# Patient Record
Sex: Female | Born: 1985 | Race: Black or African American | Hispanic: No | Marital: Single | Smoking: Current every day smoker
Health system: Southern US, Community
[De-identification: ages and names within clinical notes are randomized; demographics above are authoritative.]

## PROBLEM LIST (undated history)

## (undated) ENCOUNTER — Inpatient Hospital Stay: Payer: Self-pay

## (undated) DIAGNOSIS — E049 Nontoxic goiter, unspecified: Secondary | ICD-10-CM

## (undated) DIAGNOSIS — F32A Depression, unspecified: Secondary | ICD-10-CM

## (undated) DIAGNOSIS — D571 Sickle-cell disease without crisis: Secondary | ICD-10-CM

## (undated) DIAGNOSIS — D649 Anemia, unspecified: Secondary | ICD-10-CM

## (undated) DIAGNOSIS — J45909 Unspecified asthma, uncomplicated: Secondary | ICD-10-CM

## (undated) DIAGNOSIS — F329 Major depressive disorder, single episode, unspecified: Secondary | ICD-10-CM

---

## 2014-01-09 ENCOUNTER — Emergency Department: Payer: Self-pay | Admitting: Emergency Medicine

## 2014-01-09 LAB — COMPREHENSIVE METABOLIC PANEL
ALT: 34 U/L
Albumin: 3.4 g/dL (ref 3.4–5.0)
Alkaline Phosphatase: 186 U/L — ABNORMAL HIGH
Anion Gap: 8 (ref 7–16)
BILIRUBIN TOTAL: 0.5 mg/dL (ref 0.2–1.0)
BUN: 13 mg/dL (ref 7–18)
CHLORIDE: 105 mmol/L (ref 98–107)
CREATININE: 0.57 mg/dL — AB (ref 0.60–1.30)
Calcium, Total: 8.9 mg/dL (ref 8.5–10.1)
Co2: 23 mmol/L (ref 21–32)
EGFR (African American): >60
EGFR (Non-African Amer.): >60
Glucose: 74 mg/dL (ref 65–99)
Osmolality: 271 (ref 275–301)
Potassium: 3.7 mmol/L (ref 3.5–5.1)
SGOT(AST): 19 U/L (ref 15–37)
Sodium: 136 mmol/L (ref 136–145)
TOTAL PROTEIN: 8.5 g/dL — AB (ref 6.4–8.2)

## 2014-01-09 LAB — URINALYSIS, COMPLETE
Bacteria: NONE SEEN
Bilirubin,UR: NEGATIVE
Glucose,UR: NEGATIVE mg/dL (ref 0–75)
Nitrite: NEGATIVE
Ph: 5 (ref 4.5–8.0)
Protein: 30
Specific Gravity: 1.03 (ref 1.003–1.030)

## 2014-01-09 LAB — CBC WITH DIFFERENTIAL/PLATELET
BASOS ABS: 0.1 10*3/uL (ref 0.0–0.1)
Basophil %: 0.7 %
EOS ABS: 0.9 10*3/uL — AB (ref 0.0–0.7)
EOS PCT: 7.1 %
HCT: 36.2 % (ref 35.0–47.0)
HGB: 11.6 g/dL — ABNORMAL LOW (ref 12.0–16.0)
LYMPHS ABS: 1.6 10*3/uL (ref 1.0–3.6)
Lymphocyte %: 12.4 %
MCH: 21.4 pg — AB (ref 26.0–34.0)
MCHC: 31.9 g/dL — AB (ref 32.0–36.0)
MCV: 67 fL — ABNORMAL LOW (ref 80–100)
Monocyte #: 1.1 x10 3/mm — ABNORMAL HIGH (ref 0.2–0.9)
Monocyte %: 8.2 %
NEUTROS ABS: 9.5 10*3/uL — AB (ref 1.4–6.5)
Neutrophil %: 71.6 %
PLATELETS: 220 10*3/uL (ref 150–440)
RBC: 5.4 10*6/uL — ABNORMAL HIGH (ref 3.80–5.20)
RDW: 14.9 % — AB (ref 11.5–14.5)
WBC: 13.3 10*3/uL — ABNORMAL HIGH (ref 3.6–11.0)

## 2014-01-09 LAB — RAPID INFLUENZA A&B ANTIGENS

## 2014-01-20 ENCOUNTER — Emergency Department: Payer: Self-pay | Admitting: Emergency Medicine

## 2014-10-14 ENCOUNTER — Other Ambulatory Visit: Payer: Self-pay | Admitting: Physician Assistant

## 2014-10-14 DIAGNOSIS — E010 Iodine-deficiency related diffuse (endemic) goiter: Secondary | ICD-10-CM

## 2014-10-14 DIAGNOSIS — N946 Dysmenorrhea, unspecified: Secondary | ICD-10-CM

## 2014-10-21 ENCOUNTER — Ambulatory Visit
Admission: RE | Admit: 2014-10-21 | Discharge: 2014-10-21 | Disposition: A | Discharge status: Home / Self Care | Payer: Medicaid Other | Source: Ambulatory Visit | Attending: Physician Assistant | Admitting: Physician Assistant

## 2014-10-21 DIAGNOSIS — E010 Iodine-deficiency related diffuse (endemic) goiter: Secondary | ICD-10-CM | POA: Diagnosis not present

## 2014-10-21 DIAGNOSIS — N946 Dysmenorrhea, unspecified: Secondary | ICD-10-CM | POA: Diagnosis not present

## 2014-10-21 DIAGNOSIS — E0590 Thyrotoxicosis, unspecified without thyrotoxic crisis or storm: Secondary | ICD-10-CM | POA: Diagnosis present

## 2015-05-20 ENCOUNTER — Emergency Department
Admission: EM | Admit: 2015-05-20 | Discharge: 2015-05-20 | Disposition: A | Discharge status: Home / Self Care | Payer: Medicaid Other | Attending: Emergency Medicine | Admitting: Emergency Medicine

## 2015-05-20 ENCOUNTER — Encounter: Payer: Self-pay | Admitting: Emergency Medicine

## 2015-05-20 DIAGNOSIS — K0889 Other specified disorders of teeth and supporting structures: Secondary | ICD-10-CM | POA: Diagnosis present

## 2015-05-20 DIAGNOSIS — F17200 Nicotine dependence, unspecified, uncomplicated: Secondary | ICD-10-CM | POA: Diagnosis not present

## 2015-05-20 DIAGNOSIS — K0381 Cracked tooth: Secondary | ICD-10-CM | POA: Diagnosis not present

## 2015-05-20 DIAGNOSIS — K0263 Dental caries on smooth surface penetrating into pulp: Secondary | ICD-10-CM | POA: Insufficient documentation

## 2015-05-20 DIAGNOSIS — K029 Dental caries, unspecified: Secondary | ICD-10-CM

## 2015-05-20 DIAGNOSIS — K032 Erosion of teeth: Secondary | ICD-10-CM | POA: Insufficient documentation

## 2015-05-20 MED ORDER — MAGIC MOUTHWASH W/LIDOCAINE: 5.0000 mL | Freq: Four times a day (QID) | ORAL | Status: DC

## 2015-05-20 NOTE — ED Notes (Signed)
Pt to ed with c/o right sided toothache.

## 2015-05-20 NOTE — ED Provider Notes (Signed)
[facility] Emergency Department Provider Note  ____________________________________________  Time seen: Approximately 2:46 PM  I have reviewed the triage vital signs and the nursing notes.   HISTORY  Chief Complaint Dental Pain    HPI Katherine Hensley is a 30 y.o. female who presents emergency department complaining of right lower dental pain. Patient states that she has been dealing with this for "quite a long time." Patient states that the pain became more severe today. She denies any fevers or chills, difficult breathing or swallowing. Shortness of breath, nausea or vomiting.   History reviewed. No pertinent past medical history.  There are no active problems to display for this patient.   History reviewed. No pertinent past surgical history.  Current Outpatient Rx  Name  Route  Sig  Dispense  Refill  . magic mouthwash w/lidocaine SOLN   Oral   Take 5 mLs by mouth 4 (four) times daily.   240 mL   0     Dispense in a 1/1/1/1 ratio. Use lidocaine, diphen ...     Allergies Review of patient's allergies indicates no known allergies.  History reviewed. No pertinent family history.  Social History Social History  Substance Use Topics  . Smoking status: Current Every Day Smoker  . Smokeless tobacco: None  . Alcohol Use: No     Review of Systems  Constitutional: No fever/chills ENT: No sore throat. Endorses right lower dental pain Cardiovascular: no chest pain. Gastrointestinal:No nausea, no vomiting.   Neurological: Negative for headaches, focal weakness or numbness. 10-point ROS otherwise negative.  ____________________________________________   PHYSICAL EXAM:  VITAL SIGNS: ED Triage Vitals  Enc Vitals Group     BP 05/20/15 1401 131/90 mmHg     Pulse Rate 05/20/15 1401 61     Resp 05/20/15 1401 18     Temp 05/20/15 1401 98 F (36.7 C)     Temp Source 05/20/15 1401 Oral     SpO2 05/20/15 1401 99 %     Weight 05/20/15 1401 160  lb (72.576 kg)     Height 05/20/15 1401  (1.702 m)     Head Cir --      Peak Flow --      Pain Score 05/20/15 1401 8     Pain Loc --      Pain Edu? --      Excl. in GC? --      Constitutional: Alert and oriented. Well appearing and in no acute distress. Eyes: Conjunctivae are normal. PERRL. EOMI. Head: Atraumatic. ENT:      Ears:       Nose: No congestion/rhinnorhea.      Mouth/Throat: Mucous membranes are moist. Poor dentition throughout with multiple caries, erosion, and fractures. Patient is complaining of pain around tooth #32. There is erosion into the pulp in this tooth. No surrounding erythema or edema. No pustular drainage. Neck: No stridor.   Hematological/Lymphatic/Immunilogical: No cervical lymphadenopathy. Cardiovascular: Normal rate, regular rhythm. Normal S1 and S2.  Good peripheral circulation. Respiratory: Normal respiratory effort without tachypnea or retractions. Lungs CTAB. Neurologic:  Normal speech and language. No gross focal neurologic deficits are appreciated.  Skin:  Skin is warm, dry and intact. No rash noted. Psychiatric: Mood and affect are normal. Speech and behavior are normal. Patient exhibits appropriate insight and judgement.   ____________________________________________   LABS (all labs ordered are listed, but only abnormal results are displayed)  Labs Reviewed - No data to display ____________________________________________  EKG   ____________________________________________  RADIOLOGY   No results found.  ____________________________________________    PROCEDURES  Procedure(s) performed:       Medications - No data to display   ____________________________________________   INITIAL IMPRESSION / ASSESSMENT AND PLAN / ED COURSE  Pertinent labs & imaging results that were available during my care of the patient were reviewed by me and considered in my medical decision making (see chart for details).  Patient's  diagnosis is consistent with dental erosion into the pulp. Patient is offered a dental block here in the emergency department and declines at this time. Patient will be sent home with Magic mouthwash for symptom control. There is no signs of infection at this time and no antibiotics will be prescribed at this time. Patient is to follow-up with dentist for further evaluation and treatment of significant poor dentition. Patient is given ED precautions to return to the ED for any worsening or new symptoms.     ____________________________________________  FINAL CLINICAL IMPRESSION(S) / ED DIAGNOSES  Final diagnoses:  Dental erosion extending into pulp  Dental caries      NEW MEDICATIONS STARTED DURING THIS VISIT:  New Prescriptions   MAGIC MOUTHWASH W/LIDOCAINE SOLN    Take 5 mLs by mouth 4 (four) times daily.        Delorise Royals Cuthriell, PA-C 05/20/15 1504  Jene Every, MD 05/20/15 [phone]

## 2015-05-20 NOTE — Discharge Instructions (Signed)
Dental Care and Dentist Visits °Dental care supports good overall health. Regular dental visits can also help you avoid dental pain, bleeding, infection, and other more serious health problems in the future. It is important to keep the mouth healthy because diseases in the teeth, gums, and other oral tissues can spread to other areas of the body. Some problems, such as diabetes, heart disease, and pre-term labor have been associated with poor oral health.  °See your dentist every 6 months. If you experience emergency problems such as a toothache or broken tooth, go to the dentist right away. If you see your dentist regularly, you may catch problems early. It is easier to be treated for problems in the early stages.  °WHAT TO EXPECT AT A DENTIST VISIT  °Your dentist will look for many common oral health problems and recommend proper treatment. At your regular dental visit, you can expect: °· Gentle cleaning of the teeth and gums. This includes scraping and polishing. This helps to remove the sticky substance around the teeth and gums (plaque). Plaque forms in the mouth shortly after eating. Over time, plaque hardens on the teeth as tartar. If tartar is not removed regularly, it can cause problems. Cleaning also helps remove stains. °· Periodic X-rays. These pictures of the teeth and supporting bone will help your dentist assess the health of your teeth. °· Periodic fluoride treatments. Fluoride is a natural mineral shown to help strengthen teeth. Fluoride treatment involves applying a fluoride gel or varnish to the teeth. It is most commonly done in children. °· Examination of the mouth, tongue, jaws, teeth, and gums to look for any oral health problems, such as: °· Cavities (dental caries). This is decay on the tooth caused by plaque, sugar, and acid in the mouth. It is best to catch a cavity when it is small. °· Inflammation of the gums caused by plaque buildup (gingivitis). °· Problems with the mouth or malformed  or misaligned teeth. °· Oral cancer or other diseases of the soft tissues or jaws.  °KEEP YOUR TEETH AND GUMS HEALTHY °For healthy teeth and gums, follow these general guidelines as well as your dentist's specific advice: °· Have your teeth professionally cleaned at the dentist every 6 months. °· Brush twice daily with a fluoride toothpaste. °· Floss your teeth daily.  °· Ask your dentist if you need fluoride supplements, treatments, or fluoride toothpaste. °· Eat a healthy diet. Reduce foods and drinks with added sugar. °· Avoid smoking. °TREATMENT FOR ORAL HEALTH PROBLEMS °If you have oral health problems, treatment varies depending on the conditions present in your teeth and gums. °· Your caregiver will most likely recommend good oral hygiene at each visit. °· For cavities, gingivitis, or other oral health disease, your caregiver will perform a procedure to treat the problem. This is typically done at a separate appointment. Sometimes your caregiver will refer you to another dental specialist for specific tooth problems or for surgery. °SEEK IMMEDIATE DENTAL CARE IF: °· You have pain, bleeding, or soreness in the gum, tooth, jaw, or mouth area. °· A permanent tooth becomes loose or separated from the gum socket. °· You experience a blow or injury to the mouth or jaw area. °  °This information is not intended to replace advice given to you by your health care provider. Make sure you discuss any questions you have with your health care provider. °  °Document Released: 12/12/2010 Document Revised: 06/24/2011 Document Reviewed: 12/12/2010 °Elsevier Interactive Patient Education ©2016 Else[employer] ° °Dental Caries °Dental   caries (also called tooth decay) is the most common oral disease. It can occur at any age but is more common in children and young adults.  °HOW DENTAL CARIES DEVELOPS  °The process of decay begins when bacteria and foods (particularly sugars and starches) combine in your mouth to produce plaque.  Plaque is a substance that sticks to the hard, outer surface of a tooth (enamel). The bacteria in plaque produce acids that attack enamel. These acids may also attack the root surface of a tooth (cementum) if it is exposed. Repeated attacks dissolve these surfaces and create holes in the tooth (cavities). If left untreated, the acids destroy the other layers of the tooth.  °RISK FACTORS °· Frequent sipping of sugary beverages.   °· Frequent snacking on sugary and starchy foods, especially those that easily get stuck in the teeth.   °· Poor oral hygiene.   °· Dry mouth.   °· Substance abuse such as methamphetamine abuse.   °· Broken or poor-fitting dental restorations.   °· Eating disorders.   °· Gastroesophageal reflux disease (GERD).   °· Certain radiation treatments to the head and neck. °SYMPTOMS °In the early stages of dental caries, symptoms are seldom present. Sometimes white, chalky areas may be seen on the enamel or other tooth layers. In later stages, symptoms may include: °· Pits and holes on the enamel. °· Toothache after sweet, hot, or cold foods or drinks are consumed. °· Pain around the tooth. °· Swelling around the tooth. °DIAGNOSIS  °Most of the time, dental caries is detected during a regular dental checkup. A diagnosis is made after a thorough medical and dental history is taken and the surfaces of your teeth are checked for signs of dental caries. Sometimes special instruments, such as lasers, are used to check for dental caries. Dental X-ray exams may be taken so that areas not visible to the eye (such as between the contact areas of the teeth) can be checked for cavities.  °TREATMENT  °If dental caries is in its early stages, it may be reversed with a fluoride treatment or an application of a remineralizing agent at the dental office. Thorough brushing and flossing at home is needed to aid these treatments. If it is in its later stages, treatment depends on the location and extent of tooth  destruction:  °· If a small area of the tooth has been destroyed, the destroyed area will be removed and cavities will be filled with a material such as gold, silver amalgam, or composite resin.   °· If a large area of the tooth has been destroyed, the destroyed area will be removed and a cap (crown) will be fitted over the remaining tooth structure.   °· If the center part of the tooth (pulp) is affected, a procedure called a root canal will be needed before a filling or crown can be placed.   °· If most of the tooth has been destroyed, the tooth may need to be pulled (extracted). °HOME CARE INSTRUCTIONS °You can prevent, stop, or reverse dental caries at home by practicing good oral hygiene. Good oral hygiene includes: °· Thoroughly cleaning your teeth at least twice a day with a toothbrush and dental floss.   °· Using a fluoride toothpaste. A fluoride mouth rinse may also be used if recommended by your dentist or health care provider.   °· Restricting the amount of sugary and starchy foods and sugary liquids you consume.   °· Avoiding frequent snacking on these foods and sipping of these liquids.   °· Keeping regular visits with a dentist for   checkups and cleanings. PREVENTION   Practice good oral hygiene.  Consider a dental sealant. A dental sealant is a coating material that is applied by your dentist to the pits and grooves of teeth. The sealant prevents food from being trapped in them. It may protect the teeth for several years.  Ask about fluoride supplements if you live in a community without fluorinated water or with water that has a low fluoride content. Use fluoride supplements as directed by your dentist or health care provider.  Allow fluoride varnish applications to teeth if directed by your dentist or health care provider.   This information is not intended to replace advice given to you by your health care provider. Make sure you discuss any questions you have with your health care  provider.   Document Released: 12/22/2001 Document Revised: 04/22/2014 Document Reviewed: 04/03/2012 Elsevier Interactive Patient Education 2016 Elsevier Inc.  Dental Pain Dental pain may be caused by many things, including:  Tooth decay (cavities or caries). Cavities expose the nerve of your tooth to air and hot or cold temperatures. This can cause pain or discomfort.  Abscess or infection. A dental abscess is a collection of infected pus from a bacterial infection in the inner part of the tooth (pulp). It usually occurs at the end of the tooth's root.  Injury.  An unknown reason (idiopathic). Your pain may be mild or severe. It may only occur when:  You are chewing.  You are exposed to hot or cold temperature.  You are eating or drinking sugary foods or beverages, such as soda or candy. Your pain may also be constant. HOME CARE INSTRUCTIONS Watch your dental pain for any changes. The following actions may help to lessen any discomfort that you are feeling:  Take medicines only as directed by your dentist.  If you were prescribed an antibiotic medicine, finish all of it even if you start to feel better.  Keep all follow-up visits as directed by your dentist. This is important.  Do not apply heat to the outside of your face.  Rinse your mouth or gargle with salt water if directed by your dentist. This helps with pain and swelling.  You can make salt water by adding  tsp of salt to 1 cup of warm water.  Apply ice to the painful area of your face:  Put ice in a plastic bag.  Place a towel between your skin and the bag.  Leave the ice on for 20 minutes, 2-3 times per day.  Avoid foods or drinks that cause you pain, such as:  Very hot or very cold foods or drinks.  Sweet or sugary foods or drinks. SEEK MEDICAL CARE IF:  Your pain is not controlled with medicines.  Your symptoms are worse.  You have new symptoms. SEEK IMMEDIATE MEDICAL CARE IF:  You are unable  to open your mouth.  You are having trouble breathing or swallowing.  You have a fever.  Your face, neck, or jaw is swollen.   This information is not intended to replace advice given to you by your health care provider. Make sure you discuss any questions you have with your health care provider.   Document Released: 04/01/2005 Document Revised: 08/16/2014 Document Reviewed: 03/28/2014 Elsevier Interactive Patient Education [employer]

## 2015-08-03 ENCOUNTER — Encounter: Payer: Self-pay | Admitting: Emergency Medicine

## 2015-08-03 ENCOUNTER — Emergency Department
Admission: EM | Admit: 2015-08-03 | Discharge: 2015-08-03 | Disposition: A | Discharge status: Home / Self Care | Payer: Medicaid Other | Attending: Emergency Medicine | Admitting: Emergency Medicine

## 2015-08-03 DIAGNOSIS — R109 Unspecified abdominal pain: Secondary | ICD-10-CM | POA: Insufficient documentation

## 2015-08-03 DIAGNOSIS — Z87891 Personal history of nicotine dependence: Secondary | ICD-10-CM | POA: Insufficient documentation

## 2015-08-03 LAB — COMPREHENSIVE METABOLIC PANEL
ALT: 24 U/L (ref 14–54)
ANION GAP: 7 (ref 5–15)
AST: 23 U/L (ref 15–41)
Albumin: 4.5 g/dL (ref 3.5–5.0)
Alkaline Phosphatase: 80 U/L (ref 38–126)
BILIRUBIN TOTAL: 0.4 mg/dL (ref 0.3–1.2)
BUN: 12 mg/dL (ref 6–20)
CO2: 25 mmol/L (ref 22–32)
Calcium: 9.1 mg/dL (ref 8.9–10.3)
Chloride: 105 mmol/L (ref 101–111)
Creatinine, Ser: 0.66 mg/dL (ref 0.44–1.00)
GFR calc Af Amer: >60 mL/min (ref >60)
Glucose, Bld: 97 mg/dL (ref 65–99)
POTASSIUM: 3.9 mmol/L (ref 3.5–5.1)
Sodium: 137 mmol/L (ref 135–145)
TOTAL PROTEIN: 8.3 g/dL — AB (ref 6.5–8.1)

## 2015-08-03 LAB — URINALYSIS COMPLETE WITH MICROSCOPIC (ARMC ONLY)
BACTERIA UA: NONE SEEN
Bilirubin Urine: NEGATIVE
GLUCOSE, UA: NEGATIVE mg/dL
HGB URINE DIPSTICK: NEGATIVE
KETONES UR: NEGATIVE mg/dL
LEUKOCYTES UA: NEGATIVE
Nitrite: NEGATIVE
Protein, ur: NEGATIVE mg/dL
SPECIFIC GRAVITY, URINE: 1.024 (ref 1.005–1.030)
pH: 7 (ref 5.0–8.0)

## 2015-08-03 LAB — CBC
HEMATOCRIT: 34.1 % — AB (ref 35.0–47.0)
HEMOGLOBIN: 11.3 g/dL — AB (ref 12.0–16.0)
MCH: 23 pg — ABNORMAL LOW (ref 26.0–34.0)
MCHC: 33.3 g/dL (ref 32.0–36.0)
MCV: 69.2 fL — ABNORMAL LOW (ref 80.0–100.0)
Platelets: 223 10*3/uL (ref 150–440)
RBC: 4.92 MIL/uL (ref 3.80–5.20)
RDW: 16 % — ABNORMAL HIGH (ref 11.5–14.5)
WBC: 11 10*3/uL (ref 3.6–11.0)

## 2015-08-03 LAB — POCT PREGNANCY, URINE: PREG TEST UR: NEGATIVE

## 2015-08-03 LAB — LIPASE, BLOOD: Lipase: 17 U/L (ref 11–51)

## 2015-08-03 MED ORDER — DICYCLOMINE HCL 10 MG/ML IM SOLN
20.0000 mg | Freq: Once | INTRAMUSCULAR | Status: AC
  Administered 2015-08-03: 20 mg via INTRAMUSCULAR
  Filled 2015-08-03: qty 2

## 2015-08-03 MED ORDER — DICYCLOMINE HCL 20 MG PO TABS: 20.0000 mg | ORAL_TABLET | Freq: Three times a day (TID) | ORAL | Status: DC | PRN

## 2015-08-03 NOTE — Discharge Instructions (Signed)
Please seek medical attention for any high fevers, chest pain, shortness of breath, change in behavior, persistent vomiting, bloody stool or any other new or concerning symptoms. ° ° °Abdominal Pain, Adult °Many things can cause belly (abdominal) pain. Most times, the belly pain is not dangerous. Many cases of belly pain can be watched and treated at home. °HOME CARE  °· Do not take medicines that help you go poop (laxatives) unless told to by your doctor. °· Only take medicine as told by your doctor. °· Eat or drink as told by your doctor. Your doctor will tell you if you should be on a special diet. °GET HELP IF: °· You do not know what is causing your belly pain. °· You have belly pain while you are sick to your stomach (nauseous) or have runny poop (diarrhea). °· You have pain while you pee or poop. °· Your belly pain wakes you up at night. °· You have belly pain that gets worse or better when you eat. °· You have belly pain that gets worse when you eat fatty foods. °· You have a fever. °GET HELP RIGHT AWAY IF:  °· The pain does not go away within 2 hours. °· You keep throwing up (vomiting). °· The pain changes and is only in the right or left part of the belly. °· You have bloody or tarry looking poop. °MAKE SURE YOU:  °· Understand these instructions. °· Will watch your condition. °· Will get help right away if you are not doing well or get worse. °  °This information is not intended to replace advice given to you by your health care provider. Make sure you discuss any questions you have with your health care provider. °  °Document Released: 09/18/2007 Document Revised: 04/22/2014 Document Reviewed: 12/09/2012 °Elsevier Interactive Patient Education ©2016 Elsevier Inc. ° °

## 2015-08-03 NOTE — ED Notes (Signed)
Pt states abd pain that began this am, has had vomiting with it as well, denies diarrhea. States this pain has been off and on for years now and she can't get any answers. Pt tearful in triage. States she has had some rectal bleeding this am as well.

## 2015-08-03 NOTE — ED Provider Notes (Signed)
[facility] Emergency Department Provider Note    ____________________________________________  Time seen: ~1000  I have reviewed the triage vital signs and the nursing notes.   HISTORY  Chief Complaint Abdominal Pain   History limited by: Not Limited   HPI Katherine Hensley is a 30 y.o. female who presents to the emergency department today because of concerns for abdominal pain. Patient states pain is located on the left side. She states that it is been going on for a number of years. She states that woke her up about 2-3 times a month. She has not noticed any pattern to the pain. It is not related to her menstrual cycle. She states that it does not change depending what food she eats. She describes it as the feeling of knots. It will become severe. She does states she has noticed some blood in her diarrhea. She denies any vomiting. Denies any fevers. Denies seen a GI doctor for this although she has seen her primary care. She states she has never been told what it is.   History reviewed. No pertinent past medical history.  There are no active problems to display for this patient.   Past Surgical History  Procedure Laterality Date  . Cesarean section      Current Outpatient Rx  Name  Route  Sig  Dispense  Refill  . magic mouthwash w/lidocaine SOLN   Oral   Take 5 mLs by mouth 4 (four) times daily.   240 mL   0     Dispense in a 1/1/1/1 ratio. Use lidocaine, diphen ...     Allergies Review of patient's allergies indicates no known allergies.  No family history on file.  Social History Social History  Substance Use Topics  . Smoking status: Former Games developermoker  . Smokeless tobacco: None  . Alcohol Use: Yes     Comment: occas    Review of Systems  Constitutional: Negative for fever. Cardiovascular: Negative for chest pain. Respiratory: Negative for shortness of breath. Gastrointestinal:Positive for left-sided abdominal pain Neurological:  Negative for headaches, focal weakness or numbness.  10-point ROS otherwise negative.  ____________________________________________   PHYSICAL EXAM:  VITAL SIGNS: ED Triage Vitals  Enc Vitals Group     BP 08/03/15 0950 116/90 mmHg     Pulse Rate 08/03/15 0950 77     Resp 08/03/15 0950 18     Temp 08/03/15 0950 97.3 F (36.3 C)     Temp Source 08/03/15 0950 Oral     SpO2 08/03/15 0950 99 %     Weight 08/03/15 0950 186 lb (84.369 kg)     Height 08/03/15 0950 5\' 7"  (1.702 m)     Head Cir --      Peak Flow --      Pain Score 08/03/15 0950 10   Constitutional: Alert and oriented. Appears anxious. Eyes: Conjunctivae are normal. PERRL. Normal extraocular movements. ENT   Head: Normocephalic and atraumatic.   Nose: No congestion/rhinnorhea.   Mouth/Throat: Mucous membranes are moist.   Neck: No stridor. Hematological/Lymphatic/Immunilogical: No cervical lymphadenopathy. Cardiovascular: Normal rate, regular rhythm.  No murmurs, rubs, or gallops. Respiratory: Normal respiratory effort without tachypnea nor retractions. Breath sounds are clear and equal bilaterally. No wheezes/rales/rhonchi. Gastrointestinal: Soft and nontender. No distention.  Genitourinary: Deferred Musculoskeletal: Normal range of motion in all extremities. No joint effusions.  No lower extremity tenderness nor edema. Neurologic:  Normal speech and language. No gross focal neurologic deficits are appreciated.  Skin:  Skin is warm,  dry and intact. No rash noted. Psychiatric: Mood and affect are normal. Speech and behavior are normal. Patient exhibits appropriate insight and judgment.  ____________________________________________    LABS (pertinent positives/negatives)  Labs Reviewed  COMPREHENSIVE METABOLIC PANEL - Abnormal; Notable for the following:    Total Protein 8.3 (*)    All other components within normal limits  CBC - Abnormal; Notable for the following:    Hemoglobin 11.3 (*)    HCT  34.1 (*)    MCV 69.2 (*)    MCH 23.0 (*)    RDW 16.0 (*)    All other components within normal limits  URINALYSIS COMPLETEWITH MICROSCOPIC ([facility] ONLY) - Abnormal; Notable for the following:    Color, Urine YELLOW (*)    APPearance HAZY (*)    Squamous Epithelial / LPF 6-30 (*)    All other components within normal limits  LIPASE, BLOOD  POC URINE PREG, ED  POCT PREGNANCY, URINE     ____________________________________________   EKG  None  ____________________________________________    RADIOLOGY  None  ____________________________________________   PROCEDURES  Procedure(s) performed: None  Critical Care performed: No  ____________________________________________   INITIAL IMPRESSION / ASSESSMENT AND PLAN / ED COURSE  Pertinent labs & imaging results that were available during my care of the patient were reviewed by me and considered in my medical decision making (see chart for details).  Patient presented to the emergency department today because of concerns for left-sided abdominal pain. Blood work and urine without any concerning findings. This does seem to be somewhat chronic issue. Patient did appear to feel better after Bentyl. Will discharge home with Bentyl and give GI follow-up.  ____________________________________________   FINAL CLINICAL IMPRESSION(S) / ED DIAGNOSES  Final diagnoses:  Abdominal pain, unspecified abdominal location     Phineas Semen, MD 08/03/15 1309

## 2015-12-09 ENCOUNTER — Emergency Department
Admission: EM | Admit: 2015-12-09 | Discharge: 2015-12-09 | Disposition: A | Discharge status: Home / Self Care | Payer: Medicaid Other | Attending: Emergency Medicine | Admitting: Emergency Medicine

## 2015-12-09 ENCOUNTER — Encounter: Payer: Self-pay | Admitting: Emergency Medicine

## 2015-12-09 DIAGNOSIS — Z87891 Personal history of nicotine dependence: Secondary | ICD-10-CM | POA: Insufficient documentation

## 2015-12-09 DIAGNOSIS — K047 Periapical abscess without sinus: Secondary | ICD-10-CM | POA: Insufficient documentation

## 2015-12-09 MED ORDER — TRAMADOL HCL 50 MG PO TABS: 50.0000 mg | ORAL_TABLET | Freq: Four times a day (QID) | ORAL | 0 refills | Status: DC | PRN

## 2015-12-09 MED ORDER — AMOXICILLIN 500 MG PO CAPS: 500.0000 mg | ORAL_CAPSULE | Freq: Three times a day (TID) | ORAL | 0 refills | Status: DC

## 2015-12-09 MED ORDER — LIDOCAINE VISCOUS 2 % MT SOLN: 5.0000 mL | Freq: Four times a day (QID) | OROMUCOSAL | 0 refills | Status: DC | PRN

## 2015-12-09 MED ORDER — IBUPROFEN 600 MG PO TABS: 600.0000 mg | ORAL_TABLET | Freq: Three times a day (TID) | ORAL | 0 refills | Status: DC | PRN

## 2015-12-09 NOTE — Discharge Instructions (Signed)
Follow-up for list of dental clinics: OPTIONS FOR DENTAL FOLLOW UP CARE  [place] Department of Health and Human Services - Local Safety Net Dental Clinics [email or web address]://[email or web address].gov/dph/oralhealth/services/safetynetclinics.htm   [facility] [phone])  [facility] [phone])  [facility] [phone]  [facility] [phone])  [facility] [phone]) This clinic caters to the indigent population and is on a lottery system. Location: [employer] [address] [address], [address], [place] [facility] Hours: Wednesdays from 6pm - 9pm, patients seen by a lottery system. For dates, call or go to [email or web address] Services: Cleanings, fillings and simple extractions. Payment Options: DENTAL WORK IS FREE OF CHARGE. Bring proof of income or support. Best way to get seen: Arrive at 5:15 pm - this is a lottery, NOT first come/first serve, so arriving earlier will not increase your chances of being seen.     [facility] [phone] Select option 1 for emergencies   Location: [facility], [place], [address], [place] [facility] Hours: No walk-ins accepted - call the day before to schedule an appointment. Check in times are 9:30 am and 1:30 pm. Services: Simple extractions, temporary fillings, pulpectomy/pulp debridement, uncomplicated abscess drainage. Payment Options: PAYMENT IS DUE AT THE TIME OF SERVICE.  Fee is usually $100-200, additional surgical procedures (e.g. abscess drainage) may be extra. Cash, checks, Visa/MasterCard accepted.  Can file Medicaid if patient is covered for dental - patient should call case worker to check. No discount for [facility] patients. Best way to get seen: MUST call the day before and get onto the schedule. Can usually be seen the next 1-2 days. No walk-ins accepted.     [facility] [phone]   Location: [facility], [address], [place] [facility] Hours: M, W, Th, F 8am or 1:30pm, Tues 9a or 1:30 - first come/first served. Services: Simple extractions, temporary fillings, uncomplicated abscess drainage.  You do not need to be an [place] resident. Payment Options: PAYMENT IS DUE AT THE TIME OF SERVICE. Dental insurance, otherwise sliding scale - bring proof of income or support. Depending on income and treatment needed, cost is usually $50-200. Best way to get seen: Arrive early as it is first come/first served.     [facility] [phone]   Location: [address] Clinic Hours: Mon-Thu 8a-5p Services: Most basic dental services including extractions and fillings. Payment Options: PAYMENT IS DUE AT THE TIME OF SERVICE. Sliding scale, up to 50% off - bring proof if income or support. Medicaid with dental option accepted. Best way to get seen: Call to schedule an appointment, can usually be seen within 2 weeks OR they will try to see walk-ins - show up at 8a or 2p (you may have to wait).     [facility] [phone] [place] RESIDENTS ONLY   Location: [facility], [address], [place], [place] [address] Clinic Hours: By appointment only. Monday - Thursday 8am-5pm, Friday 8am-12pm Services: Cleanings, fillings, extractions. Payment Options: PAYMENT IS DUE AT THE TIME OF SERVICE. Cash, Visa or MasterCard. Sliding scale - $30 minimum per service. Best way to get seen: Come in to office, complete packet and make an appointment - need proof of income or support monies for each household member and proof of [facility] residence. Usually takes about a month to get in.     [facility] [phone]  Location: [address], [place] Clinic Hours: Walk-in Urgent Care Dental Services are  offered Monday-Friday mornings only. The numbers of emergencies accepted daily is limited to the number of providers available. Maximum 15 - Mondays, Wednesdays & Thursdays Maximum 10 - Tuesdays & Fridays Services: You do not need to be a [facility] resident to be seen for a dental emergency. Emergencies are defined as pain, swelling, abnormal bleeding, or dental trauma. Walkins will receive x-rays if needed. NOTE: Dental cleaning is not an emergency. Payment Options: PAYMENT IS DUE AT THE TIME OF SERVICE. Minimum co-pay is $40.00 for uninsured patients. Minimum co-pay is $3.00 for Medicaid with dental coverage. Dental Insurance is accepted and must be presented at time of visit. Medicare does not cover dental. Forms of payment: Cash, credit card, checks. Best way to get seen: If not previously registered with the clinic, walk-in dental registration begins at 7:15 am and is on a first come/first serve basis. If previously registered with the clinic, call to make an appointment.     [facility] [place] ONLY   Location: [address], [place], [place] Clinic Hours: Mon-Thu 10a-2p Services: Extractions only! Payment Options: FREE (donations accepted) - bring proof of income or support Best way to get seen: Call and schedule an appointment OR come at 8am on the 1st Monday of every month (except for holidays) when it is first come/first served.     [facility] [phone]   Location: [place], [place] Clinic Hours: Friday mornings Services, Payment Options, Best way to get seen: Call for info

## 2015-12-09 NOTE — ED Provider Notes (Signed)
[facility] Emergency Department Provider Note   ____________________________________________   None    (approximate)  I have reviewed the triage vital signs and the nursing notes.   HISTORY  Chief Complaint Abscess    HPI Katherine Hensley is a 30 y.o. female patient complaining of dental pain is increased in the last 2 days. Patient also has some mild facial swelling to the left low side of her face. Patient aware that she has a D5 [place] [employer] pastime made appointment for a dentist. Patient denies any fever associated with this complaint. Patient rates the pain as a 10 over 10. No positive measures taken today for this complaint.   History reviewed. No pertinent past medical history.  There are no active problems to display for this patient.   Past Surgical History:  Procedure Laterality Date  . CESAREAN SECTION      Prior to Admission medications   Medication Sig Start Date End Date Taking? Authorizing Provider  acetaminophen (TYLENOL) 325 MG tablet Take 650 mg by mouth every 6 (six) hours as needed.    Historical Provider, MD  amoxicillin (AMOXIL) 500 MG capsule Take 1 capsule (500 mg total) by mouth 3 (three) times daily. 12/09/15   Joni Reining, PA-C  dicyclomine (BENTYL) 20 MG tablet Take 1 tablet (20 mg total) by mouth 3 (three) times daily as needed (abdominal pain). 08/03/15   Phineas Semen, MD  ibuprofen (ADVIL,MOTRIN) 600 MG tablet Take 1 tablet (600 mg total) by mouth every 8 (eight) hours as needed. 12/09/15   Joni Reining, PA-C  lidocaine (XYLOCAINE) 2 % solution Use as directed 5 mLs in the mouth or throat every 6 (six) hours as needed for mouth pain. For oral  swish 12/09/15   Joni Reining, PA-C  traMADol (ULTRAM) 50 MG tablet Take 1 tablet (50 mg total) by mouth every 6 (six) hours as needed. 12/09/15 12/08/16  Joni Reining, PA-C    Allergies Review of patient's allergies indicates no known allergies.  History reviewed. No pertinent  family history.  Social History Social History  Substance Use Topics  . Smoking status: Former Games developer  . Smokeless tobacco: Never Used  . Alcohol use Yes     Comment: occas    Review of Systems Constitutional: No fever/chills Eyes: No visual changes. ENT: No sore throat. Dental pain Cardiovascular: Denies chest pain. Respiratory: Denies shortness of breath. Gastrointestinal: No abdominal pain.  No nausea, no vomiting.  No diarrhea.  No constipation. Genitourinary: Negative for dysuria. Musculoskeletal: Negative for back pain. Skin: Negative for rash. Neurological: Negative for headaches, focal weakness or numbness.    ____________________________________________   PHYSICAL EXAM:  VITAL SIGNS: ED Triage Vitals [12/09/15 1457]  Enc Vitals Group     BP 133/85     Pulse Rate 77     Resp 18     Temp 99.6 F (37.6 C)     Temp src      SpO2 99 %     Weight 165 lb (74.8 kg)     Height 5\' 7"  (1.702 m)     Head Circumference      Peak Flow      Pain Score      Pain Loc      Pain Edu?      Excl. in GC?     Constitutional: Alert and oriented. Well appearing and in no acute distress. Eyes: Conjunctivae are normal. PERRL. EOMI. Head: Atraumatic. Nose: No congestion/rhinnorhea. Mouth/Throat:  Mucous membranes are moist. Multiple caries and fractures to the left lower molars. Gingiva is erythematous. Oropharynx non-erythematous. Neck: No stridor.  No cervical spine tenderness to palpation. Hematological/Lymphatic/Immunilogical: No cervical lymphadenopathy. Cardiovascular: Normal rate, regular rhythm. Grossly normal heart sounds.  Good peripheral circulation. Respiratory: Normal respiratory effort.  No retractions. Lungs CTAB. Gastrointestinal: Soft and nontender. No distention. No abdominal bruits. No CVA tenderness. Musculoskeletal: No lower extremity tenderness nor edema.  No joint effusions. Neurologic:  Normal speech and language. No gross focal neurologic deficits are  appreciated. No gait instability. Skin:  Skin is warm, dry and intact. No rash noted. Psychiatric: Mood and affect are normal. Speech and behavior are normal.  ____________________________________________   LABS (all labs ordered are listed, but only abnormal results are displayed)  Labs Reviewed - No data to display ____________________________________________  EKG   ____________________________________________  RADIOLOGY   ____________________________________________   PROCEDURES  Procedure(s) performed: None  Procedures  Critical Care performed: No  ____________________________________________   INITIAL IMPRESSION / ASSESSMENT AND PLAN / ED COURSE  Pertinent labs & imaging results that were available during my care of the patient were reviewed by me and considered in my medical decision making (see chart for details).  Dental pain secondary to multiple caries. Patient given discharge care instructions. Patient given a list of dental clinics for follow-up treatment. Patient given a prescription for tramadol, ibuprofen, Amoxil, and viscous lidocaine.  Clinical Course     ____________________________________________   FINAL CLINICAL IMPRESSION(S) / ED DIAGNOSES  Final diagnoses:  Dental abscess      NEW MEDICATIONS STARTED DURING THIS VISIT:  New Prescriptions   AMOXICILLIN (AMOXIL) 500 MG CAPSULE    Take 1 capsule (500 mg total) by mouth 3 (three) times daily.   IBUPROFEN (ADVIL,MOTRIN) 600 MG TABLET    Take 1 tablet (600 mg total) by mouth every 8 (eight) hours as needed.   LIDOCAINE (XYLOCAINE) 2 % SOLUTION    Use as directed 5 mLs in the mouth or throat every 6 (six) hours as needed for mouth pain. For oral  swish   TRAMADOL (ULTRAM) 50 MG TABLET    Take 1 tablet (50 mg total) by mouth every 6 (six) hours as needed.     Note:  This document was prepared using Dragon voice recognition software and may include unintentional dictation errors.      Joni Reiningonald K Smith, PA-C 12/09/15 1536    Emily FilbertJonathan E Williams, MD 12/10/15 [phone]

## 2015-12-09 NOTE — ED Notes (Signed)
Large area of swelling noted to left jaw area, pt tearful, states tenderness and pain since last night

## 2015-12-09 NOTE — ED Notes (Signed)
Throat is not swollen, area is in the front part of the mouth. Able to swallow with no difficulties

## 2015-12-09 NOTE — ED Triage Notes (Signed)
Abscessed tooth with swelling to left side of face - states has a tooth that needs pulled but hasn't made appt.

## 2015-12-26 ENCOUNTER — Emergency Department
Admission: EM | Admit: 2015-12-26 | Discharge: 2015-12-26 | Disposition: A | Discharge status: Home / Self Care | Payer: Medicaid Other | Attending: Emergency Medicine | Admitting: Emergency Medicine

## 2015-12-26 ENCOUNTER — Encounter: Payer: Self-pay | Admitting: Emergency Medicine

## 2015-12-26 DIAGNOSIS — F17200 Nicotine dependence, unspecified, uncomplicated: Secondary | ICD-10-CM | POA: Insufficient documentation

## 2015-12-26 DIAGNOSIS — Z791 Long term (current) use of non-steroidal anti-inflammatories (NSAID): Secondary | ICD-10-CM | POA: Insufficient documentation

## 2015-12-26 DIAGNOSIS — O99331 Smoking (tobacco) complicating pregnancy, first trimester: Secondary | ICD-10-CM | POA: Insufficient documentation

## 2015-12-26 DIAGNOSIS — Z3A01 Less than 8 weeks gestation of pregnancy: Secondary | ICD-10-CM | POA: Insufficient documentation

## 2015-12-26 DIAGNOSIS — O209 Hemorrhage in early pregnancy, unspecified: Secondary | ICD-10-CM | POA: Insufficient documentation

## 2015-12-26 DIAGNOSIS — N939 Abnormal uterine and vaginal bleeding, unspecified: Secondary | ICD-10-CM

## 2015-12-26 LAB — PREGNANCY, URINE: Preg Test, Ur: NEGATIVE

## 2015-12-26 LAB — HCG, QUANTITATIVE, PREGNANCY: hCG, Beta Chain, Quant, S: <1 m[IU]/mL (ref <5)

## 2015-12-26 NOTE — ED Provider Notes (Signed)
Time Seen: Approximately 1407  I have reviewed the triage notes  Chief Complaint: Vaginal Bleeding   History of Present Illness: Katherine Hensley is a 30 y.o. female who states that she took a pregnancy test yesterday at home and she was positive. She states her last normal menstrual period was August 12. The patient states that this would be her fifth pregnancy she's had 3 live births and one miscarriage. No significant complications with her previous pregnancy. She has no local OB/GYN care. She states that she noticed some vaginal bleeding earlier today and describes it as a spotty vaginal bleeding with some left lower quadrant abdominal cramping. She denies any dysuria, hematuria or urinary frequency. She denies any fever or right side abdominal pain. States pain will occasionally radiate to the back. She did not take anything at home for pain. She denies any fever, nausea, vomiting. She denies any melena or hematochezia. She denies any vaginal discharge   History reviewed. No pertinent past medical history.  There are no active problems to display for this patient.   Past Surgical History:  Procedure Laterality Date  . CESAREAN SECTION      Past Surgical History:  Procedure Laterality Date  . CESAREAN SECTION      Current Outpatient Rx  . Order #: [id number] Class: Historical Med  . Order #: [id number] Class: Print  . Order #: [id number] Class: Print  . Order #: [id number] Class: Print  . Order #: [id number] Class: Print  . Order #: [id number] Class: Print    Allergies:  Review of patient's allergies indicates no known allergies.  Family History: History reviewed. No pertinent family history.  Social History: Social History  Substance Use Topics  . Smoking status: Current Every Day Smoker  . Smokeless tobacco: Never Used  . Alcohol use Yes     Comment: occas     Review of Systems:   10 point review of systems was performed and was otherwise negative:  Constitutional: No  fever Eyes: No visual disturbances ENT: No sore throat, ear pain Cardiac: No chest pain Respiratory: No shortness of breath, wheezing, or stridor Abdomen: Mild left lower quadrant abdominal pain without loose stool or diarrhea Endocrine: No weight loss, No night sweats Extremities: No peripheral edema, cyanosis Skin: No rashes, easy bruising Neurologic: No focal weakness, trouble with speech or swollowing Urologic: No dysuria, Hematuria, or urinary frequency   Physical Exam:  ED Triage Vitals  Enc Vitals Group     BP 12/26/15 1351 136/86     Pulse Rate 12/26/15 1351 81     Resp 12/26/15 1351 18     Temp 12/26/15 1351 98.2 F (36.8 C)     Temp Source 12/26/15 1351 Oral     SpO2 12/26/15 1351 100 %     Weight 12/26/15 1352 165 lb (74.8 kg)     Height 12/26/15 1352 5\' 7"  (1.702 m)     Head Circumference --      Peak Flow --      Pain Score 12/26/15 1352 5     Pain Loc --      Pain Edu? --      Excl. in GC? --     General: Awake , Alert , and Oriented times 3; GCS 15 Head: Normal cephalic , atraumatic Eyes: Pupils equal , round, reactive to light Nose/Throat: No nasal drainage, patent upper airway without erythema or exudate.  Neck: Supple, Full range of motion, No anterior adenopathy or palpable thyroid masses Lungs: Clear to ascultation without wheezes ,  rhonchi, or rales Heart: Regular rate, regular rhythm without murmurs , gallops , or rubs Abdomen: Soft, non tender without rebound, guarding , or rigidity; bowel sounds positive and symmetric in all 4 quadrants. No organomegaly .        Extremities: 2 plus symmetric pulses. No edema, clubbing or cyanosis Neurologic: normal ambulation, Motor symmetric without deficits, sensory intact Skin: warm, dry, no rashes   Labs:   All laboratory work was reviewed including any pertinent negatives or positives listed below:  Labs Reviewed  HCG, QUANTITATIVE, PREGNANCY  CBC WITH DIFFERENTIAL/PLATELET  POC URINE PREG, ED  TYPE  AND SCREEN   Laboratory work was reviewed and showed no clinically significant abnormalities. Pregnancy test was negative by urine and blood work  ED Course:  Patient's stay here was uneventful and her differential dependent on a pregnancy test which seems to be negative at this time and this may just be normal menstrual bleeding. The patient's normally starting her cycle at this particular time. She does not seem to have dysfunctional uterine bleeding. She is otherwise hemodynamically stable and afebrile. Clinical Course     Assessment: Menstrual bleeding Rule out pregnancy      Plan:  Outpatient Patient was advised to return immediately if condition worsens. Patient was advised to follow up with their primary care physician or other specialized physicians involved in their outpatient care. The patient and/or family member/power of attorney had laboratory results reviewed at the bedside. All questions and concerns were addressed and appropriate discharge instructions were distributed by the nursing staff.            Jennye Moccasin, MD 12/26/15 [phone]

## 2015-12-26 NOTE — ED Triage Notes (Signed)
Pt to ed with c/o vaginal bleeding that started today.  Pt reports she took pregnancy test last week and they were positive, reports LMP was 5 weeks ago.  Pt also reprots abd pain and cramping.

## 2015-12-26 NOTE — Discharge Instructions (Signed)
Please return to the emergency department if her bleeding persists beyond your normal menstrual cycle, you develop a fever, increasing abdominal pain, or any other new concerns. Over-the-counter Tylenol or Motrin for pain.  Please return immediately if condition worsens. Please contact her primary physician or the physician you were given for referral. If you have any specialist physicians involved in her treatment and plan please also contact them. Thank you for using [facility] emergency [facility].

## 2016-02-10 ENCOUNTER — Emergency Department
Admission: EM | Admit: 2016-02-10 | Discharge: 2016-02-10 | Disposition: A | Discharge status: Home / Self Care | Payer: Medicaid Other | Attending: Emergency Medicine | Admitting: Emergency Medicine

## 2016-02-10 ENCOUNTER — Encounter: Payer: Self-pay | Admitting: Emergency Medicine

## 2016-02-10 DIAGNOSIS — F17290 Nicotine dependence, other tobacco product, uncomplicated: Secondary | ICD-10-CM | POA: Insufficient documentation

## 2016-02-10 DIAGNOSIS — Z5321 Procedure and treatment not carried out due to patient leaving prior to being seen by health care provider: Secondary | ICD-10-CM | POA: Diagnosis not present

## 2016-02-10 DIAGNOSIS — R1030 Lower abdominal pain, unspecified: Secondary | ICD-10-CM | POA: Insufficient documentation

## 2016-02-10 HISTORY — DX: Sickle-cell disease without crisis: D57.1

## 2016-02-10 LAB — URINALYSIS COMPLETE WITH MICROSCOPIC (ARMC ONLY)
BACTERIA UA: NONE SEEN
BILIRUBIN URINE: NEGATIVE
GLUCOSE, UA: NEGATIVE mg/dL
HGB URINE DIPSTICK: NEGATIVE
Ketones, ur: NEGATIVE mg/dL
LEUKOCYTES UA: NEGATIVE
NITRITE: NEGATIVE
Protein, ur: NEGATIVE mg/dL
SPECIFIC GRAVITY, URINE: 1.021 (ref 1.005–1.030)
pH: 6 (ref 5.0–8.0)

## 2016-02-10 LAB — COMPREHENSIVE METABOLIC PANEL
ALBUMIN: 4.3 g/dL (ref 3.5–5.0)
ALK PHOS: 58 U/L (ref 38–126)
ALT: 16 U/L (ref 14–54)
AST: 18 U/L (ref 15–41)
Anion gap: 8 (ref 5–15)
BILIRUBIN TOTAL: 0.5 mg/dL (ref 0.3–1.2)
BUN: 10 mg/dL (ref 6–20)
CALCIUM: 9 mg/dL (ref 8.9–10.3)
CO2: 24 mmol/L (ref 22–32)
CREATININE: 0.57 mg/dL (ref 0.44–1.00)
Chloride: 104 mmol/L (ref 101–111)
GFR calc Af Amer: >60 mL/min (ref >60)
GLUCOSE: 93 mg/dL (ref 65–99)
Potassium: 3.7 mmol/L (ref 3.5–5.1)
Sodium: 136 mmol/L (ref 135–145)
TOTAL PROTEIN: 8.3 g/dL — AB (ref 6.5–8.1)

## 2016-02-10 LAB — POCT PREGNANCY, URINE: PREG TEST UR: POSITIVE — AB

## 2016-02-10 LAB — HCG, QUANTITATIVE, PREGNANCY: HCG, BETA CHAIN, QUANT, S: 52646 m[IU]/mL — AB (ref <5)

## 2016-02-10 LAB — CBC
HEMATOCRIT: 31 % — AB (ref 35.0–47.0)
Hemoglobin: 10.3 g/dL — ABNORMAL LOW (ref 12.0–16.0)
MCH: 22.3 pg — ABNORMAL LOW (ref 26.0–34.0)
MCHC: 33.1 g/dL (ref 32.0–36.0)
MCV: 67.3 fL — ABNORMAL LOW (ref 80.0–100.0)
PLATELETS: 193 10*3/uL (ref 150–440)
RBC: 4.6 MIL/uL (ref 3.80–5.20)
RDW: 18.9 % — AB (ref 11.5–14.5)
WBC: 8.3 10*3/uL (ref 3.6–11.0)

## 2016-02-10 LAB — LIPASE, BLOOD: LIPASE: 25 U/L (ref 11–51)

## 2016-02-10 MED ORDER — ONDANSETRON 4 MG PO TBDP: 4.0000 mg | ORAL_TABLET | Freq: Once | ORAL | Status: DC | PRN

## 2016-02-10 NOTE — ED Notes (Signed)
Pt denies any nausea at this time. Will hold off on Zofran for now.

## 2016-02-10 NOTE — ED Triage Notes (Signed)
Pt presents to ED with significant other and children with c/o lower abdominal pain that started this morning while she was at work. Pt states she has been having some nausea and bloating. Pt states the pain is intermittent. Denies and pain with urination or foul odor or discharge.

## 2016-02-12 ENCOUNTER — Emergency Department: Payer: Medicaid Other

## 2016-02-12 ENCOUNTER — Encounter: Payer: Self-pay | Admitting: Emergency Medicine

## 2016-02-12 ENCOUNTER — Emergency Department
Admission: EM | Admit: 2016-02-12 | Discharge: 2016-02-13 | Disposition: A | Discharge status: Other Acute Inpatient Hospital | Payer: Medicaid Other | Attending: Emergency Medicine | Admitting: Emergency Medicine

## 2016-02-12 DIAGNOSIS — O99331 Smoking (tobacco) complicating pregnancy, first trimester: Secondary | ICD-10-CM | POA: Diagnosis not present

## 2016-02-12 DIAGNOSIS — S8292XA Unspecified fracture of left lower leg, initial encounter for closed fracture: Secondary | ICD-10-CM | POA: Insufficient documentation

## 2016-02-12 DIAGNOSIS — R102 Pelvic and perineal pain: Secondary | ICD-10-CM | POA: Insufficient documentation

## 2016-02-12 DIAGNOSIS — X810XXA Intentional self-harm by jumping or lying in front of motor vehicle, initial encounter: Secondary | ICD-10-CM | POA: Insufficient documentation

## 2016-02-12 DIAGNOSIS — Y999 Unspecified external cause status: Secondary | ICD-10-CM | POA: Diagnosis not present

## 2016-02-12 DIAGNOSIS — Y92410 Unspecified street and highway as the place of occurrence of the external cause: Secondary | ICD-10-CM | POA: Insufficient documentation

## 2016-02-12 DIAGNOSIS — Z3A01 Less than 8 weeks gestation of pregnancy: Secondary | ICD-10-CM | POA: Diagnosis not present

## 2016-02-12 DIAGNOSIS — F17290 Nicotine dependence, other tobacco product, uncomplicated: Secondary | ICD-10-CM | POA: Diagnosis not present

## 2016-02-12 DIAGNOSIS — Z79899 Other long term (current) drug therapy: Secondary | ICD-10-CM | POA: Insufficient documentation

## 2016-02-12 DIAGNOSIS — O9A211 Injury, poisoning and certain other consequences of external causes complicating pregnancy, first trimester: Secondary | ICD-10-CM | POA: Insufficient documentation

## 2016-02-12 DIAGNOSIS — F1110 Opioid abuse, uncomplicated: Secondary | ICD-10-CM | POA: Diagnosis present

## 2016-02-12 DIAGNOSIS — F17200 Nicotine dependence, unspecified, uncomplicated: Secondary | ICD-10-CM | POA: Diagnosis present

## 2016-02-12 DIAGNOSIS — O26899 Other specified pregnancy related conditions, unspecified trimester: Secondary | ICD-10-CM

## 2016-02-12 DIAGNOSIS — R451 Restlessness and agitation: Secondary | ICD-10-CM

## 2016-02-12 DIAGNOSIS — Y939 Activity, unspecified: Secondary | ICD-10-CM | POA: Diagnosis not present

## 2016-02-12 DIAGNOSIS — O99341 Other mental disorders complicating pregnancy, first trimester: Secondary | ICD-10-CM | POA: Insufficient documentation

## 2016-02-12 DIAGNOSIS — Z3490 Encounter for supervision of normal pregnancy, unspecified, unspecified trimester: Secondary | ICD-10-CM

## 2016-02-12 DIAGNOSIS — F1220 Cannabis dependence, uncomplicated: Secondary | ICD-10-CM | POA: Diagnosis present

## 2016-02-12 DIAGNOSIS — T1491XA Suicide attempt, initial encounter: Secondary | ICD-10-CM | POA: Insufficient documentation

## 2016-02-12 DIAGNOSIS — S82892A Other fracture of left lower leg, initial encounter for closed fracture: Secondary | ICD-10-CM

## 2016-02-12 LAB — URINE DRUG SCREEN, QUALITATIVE (ARMC ONLY)
Amphetamines, Ur Screen: NOT DETECTED
Barbiturates, Ur Screen: NOT DETECTED
Benzodiazepine, Ur Scrn: NOT DETECTED
CANNABINOID 50 NG, UR ~~LOC~~: POSITIVE — AB
COCAINE METABOLITE, UR ~~LOC~~: NOT DETECTED
MDMA (ECSTASY) UR SCREEN: NOT DETECTED
METHADONE SCREEN, URINE: NOT DETECTED
OPIATE, UR SCREEN: POSITIVE — AB
Phencyclidine (PCP) Ur S: NOT DETECTED
TRICYCLIC, UR SCREEN: NOT DETECTED

## 2016-02-12 LAB — URINALYSIS COMPLETE WITH MICROSCOPIC (ARMC ONLY)
BILIRUBIN URINE: NEGATIVE
Bacteria, UA: NONE SEEN
GLUCOSE, UA: NEGATIVE mg/dL
HGB URINE DIPSTICK: NEGATIVE
KETONES UR: NEGATIVE mg/dL
LEUKOCYTES UA: NEGATIVE
Nitrite: NEGATIVE
Protein, ur: NEGATIVE mg/dL
Specific Gravity, Urine: 1.016 (ref 1.005–1.030)
pH: 7 (ref 5.0–8.0)

## 2016-02-12 LAB — CBC WITH DIFFERENTIAL/PLATELET
BASOS ABS: 0 10*3/uL (ref 0–0.1)
BASOS PCT: 0 %
EOS ABS: 0 10*3/uL (ref 0–0.7)
EOS PCT: 0 %
HCT: 30.8 % — ABNORMAL LOW (ref 35.0–47.0)
Hemoglobin: 10 g/dL — ABNORMAL LOW (ref 12.0–16.0)
LYMPHS PCT: 21 %
Lymphs Abs: 2.3 10*3/uL (ref 1.0–3.6)
MCH: 21.8 pg — ABNORMAL LOW (ref 26.0–34.0)
MCHC: 32.4 g/dL (ref 32.0–36.0)
MCV: 67.3 fL — AB (ref 80.0–100.0)
MONO ABS: 0.8 10*3/uL (ref 0.2–0.9)
Monocytes Relative: 7 %
Neutro Abs: 8.1 10*3/uL — ABNORMAL HIGH (ref 1.4–6.5)
Neutrophils Relative %: 72 %
Platelets: 192 10*3/uL (ref 150–440)
RBC: 4.58 MIL/uL (ref 3.80–5.20)
RDW: 18.7 % — AB (ref 11.5–14.5)
WBC: 11.3 10*3/uL — AB (ref 3.6–11.0)

## 2016-02-12 LAB — COMPREHENSIVE METABOLIC PANEL
ALK PHOS: 53 U/L (ref 38–126)
ALT: 14 U/L (ref 14–54)
ANION GAP: 10 (ref 5–15)
AST: 18 U/L (ref 15–41)
Albumin: 4 g/dL (ref 3.5–5.0)
BUN: 9 mg/dL (ref 6–20)
CALCIUM: 9 mg/dL (ref 8.9–10.3)
CO2: 21 mmol/L — AB (ref 22–32)
CREATININE: 0.52 mg/dL (ref 0.44–1.00)
Chloride: 101 mmol/L (ref 101–111)
Glucose, Bld: 99 mg/dL (ref 65–99)
Potassium: 3.2 mmol/L — ABNORMAL LOW (ref 3.5–5.1)
SODIUM: 132 mmol/L — AB (ref 135–145)
TOTAL PROTEIN: 7.6 g/dL (ref 6.5–8.1)
Total Bilirubin: <0.1 mg/dL — ABNORMAL LOW (ref 0.3–1.2)

## 2016-02-12 LAB — PREGNANCY, URINE: Preg Test, Ur: POSITIVE — AB

## 2016-02-12 LAB — HCG, QUANTITATIVE, PREGNANCY: hCG, Beta Chain, Quant, S: 58525 m[IU]/mL — ABNORMAL HIGH (ref <5)

## 2016-02-12 LAB — SALICYLATE LEVEL

## 2016-02-12 LAB — ACETAMINOPHEN LEVEL: Acetaminophen (Tylenol), Serum: <10 ug/mL — ABNORMAL LOW (ref 10–30)

## 2016-02-12 LAB — ETHANOL

## 2016-02-12 MED ORDER — ONDANSETRON HCL 4 MG/2ML IJ SOLN
INTRAMUSCULAR | Status: AC
  Administered 2016-02-12: 4 mg via INTRAVENOUS
  Filled 2016-02-12: qty 2

## 2016-02-12 MED ORDER — ONDANSETRON HCL 4 MG/2ML IJ SOLN
4.0000 mg | Freq: Once | INTRAMUSCULAR | Status: AC
  Administered 2016-02-12: 4 mg via INTRAVENOUS

## 2016-02-12 MED ORDER — HYDROMORPHONE HCL 1 MG/ML IJ SOLN
1.0000 mg | Freq: Once | INTRAMUSCULAR | Status: AC
  Administered 2016-02-12: 1 mg via INTRAVENOUS
  Filled 2016-02-12: qty 1

## 2016-02-12 NOTE — ED Notes (Signed)
Pt. Transported to [place] with officer present.

## 2016-02-12 NOTE — ED Notes (Signed)
Helped pt to bathroom and dressed out pt.

## 2016-02-12 NOTE — ED Notes (Signed)
Pt. Is upset, yelling and cursing, loudly verbalizing her feelings of unjust tx due to "violating her rights" by not allowing her to leave. IVC explained to pt by this RN without pt understanding.
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
## 2016-02-12 NOTE — ED Notes (Signed)
Pt. Returned to tx. room in stable condition with no acute changes since departure from unit for scans.   

## 2016-02-12 NOTE — ED Notes (Signed)
This RN explained that although I cannot do anything to change the status of her tx progression or current IVC status, I'm happy to get her what I am able to, to keep her comfortable during her stay.

## 2016-02-12 NOTE — ED Notes (Signed)
Pt  Moved  To  RM  26   UNDER  IVC  PENDING  CONSULT

## 2016-02-12 NOTE — ED Notes (Signed)
Pt. Provided with sandwich tray and apple juice per pt. Request at MD approval.
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
## 2016-02-13 ENCOUNTER — Inpatient Hospital Stay
Admission: RE | Admit: 2016-02-13 | Discharge: 2016-02-13 | DRG: 781 | Disposition: A | Discharge status: Other Acute Inpatient Hospital | Payer: Medicaid Other | Source: Intra-hospital | Attending: Psychiatry | Admitting: Psychiatry

## 2016-02-13 DIAGNOSIS — O99341 Other mental disorders complicating pregnancy, first trimester: Secondary | ICD-10-CM | POA: Diagnosis present

## 2016-02-13 DIAGNOSIS — O99331 Smoking (tobacco) complicating pregnancy, first trimester: Secondary | ICD-10-CM | POA: Diagnosis present

## 2016-02-13 DIAGNOSIS — O99321 Drug use complicating pregnancy, first trimester: Secondary | ICD-10-CM | POA: Diagnosis present

## 2016-02-13 DIAGNOSIS — F1110 Opioid abuse, uncomplicated: Secondary | ICD-10-CM | POA: Diagnosis present

## 2016-02-13 DIAGNOSIS — R45851 Suicidal ideations: Secondary | ICD-10-CM | POA: Diagnosis present

## 2016-02-13 DIAGNOSIS — F4325 Adjustment disorder with mixed disturbance of emotions and conduct: Secondary | ICD-10-CM | POA: Diagnosis present

## 2016-02-13 DIAGNOSIS — T1491XA Suicide attempt, initial encounter: Secondary | ICD-10-CM | POA: Diagnosis present

## 2016-02-13 DIAGNOSIS — D571 Sickle-cell disease without crisis: Secondary | ICD-10-CM | POA: Diagnosis present

## 2016-02-13 DIAGNOSIS — M79673 Pain in unspecified foot: Secondary | ICD-10-CM | POA: Diagnosis present

## 2016-02-13 DIAGNOSIS — F1220 Cannabis dependence, uncomplicated: Secondary | ICD-10-CM | POA: Diagnosis present

## 2016-02-13 DIAGNOSIS — G4700 Insomnia, unspecified: Secondary | ICD-10-CM | POA: Diagnosis present

## 2016-02-13 DIAGNOSIS — Z3490 Encounter for supervision of normal pregnancy, unspecified, unspecified trimester: Secondary | ICD-10-CM

## 2016-02-13 DIAGNOSIS — F17200 Nicotine dependence, unspecified, uncomplicated: Secondary | ICD-10-CM | POA: Diagnosis present

## 2016-02-13 MED ORDER — ACETAMINOPHEN 325 MG PO TABS
650.0000 mg | ORAL_TABLET | Freq: Four times a day (QID) | ORAL | Status: DC | PRN
  Administered 2016-02-13 (×2): 650 mg via ORAL
  Filled 2016-02-13 (×2): qty 2

## 2016-02-13 MED ORDER — FOLIC ACID 1 MG PO TABS: 2.0000 mg | ORAL_TABLET | Freq: Every day | ORAL | 1 refills | Status: DC

## 2016-02-13 MED ORDER — COMPLETENATE 29-1 MG PO CHEW: 1.0000 | CHEWABLE_TABLET | Freq: Every day | ORAL | Status: DC

## 2016-02-13 MED ORDER — FOLIC ACID 1 MG PO TABS: 2.0000 mg | ORAL_TABLET | Freq: Every day | ORAL | Status: DC

## 2016-02-13 MED ORDER — ZOLPIDEM TARTRATE 5 MG PO TABS
5.0000 mg | ORAL_TABLET | Freq: Every evening | ORAL | Status: DC | PRN
  Administered 2016-02-13: 5 mg via ORAL
  Filled 2016-02-13: qty 1

## 2016-02-13 MED ORDER — NICOTINE 21 MG/24HR TD PT24: 21.0000 mg | MEDICATED_PATCH | Freq: Every day | TRANSDERMAL | Status: DC

## 2016-02-13 MED ORDER — ALUM & MAG HYDROXIDE-SIMETH 200-200-20 MG/5ML PO SUSP: 30.0000 mL | ORAL | Status: DC | PRN

## 2016-02-13 MED ORDER — PRENATAL PLUS 27-1 MG PO TABS
1.0000 | ORAL_TABLET | Freq: Every day | ORAL | Status: DC
  Administered 2016-02-13: 1 via ORAL
  Filled 2016-02-13: qty 1

## 2016-02-13 MED ORDER — MAGNESIUM HYDROXIDE 400 MG/5ML PO SUSP: 30.0000 mL | Freq: Every day | ORAL | Status: DC | PRN

## 2016-02-13 MED ORDER — PRENATAL PLUS 27-1 MG PO TABS: 1.0000 | ORAL_TABLET | Freq: Every day | ORAL | 1 refills | Status: DC

## 2016-02-13 NOTE — Progress Notes (Addendum)
Patient resting in chair in room in a.m. Has been using wheelchair for mobility around the unit. Upset as to why she was on this unit. Is [redacted] weeks pregnant and has splint to left foot from injury prior to unit. Foot has capillary refill less than 3 secs and warm to touch. Did complain of pain and medicated per orders. No SI/HI or AVH. States she sleep fair last night and her appetite has been fair. Goal for today was to discharge, maintain her job, and the needs of her children. Was seen in treatment team and plans to discharge today. Did attend groups today and interacted with peers. Support offered and safety maintained with q15 minute checks. Will continue to monitor.
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
## 2016-02-13 NOTE — [facility] Suicide Risk Assessment (Deleted)
[facility]   Nursing information obtained from:  Patient Demographic factors:  Gay, lesbian, or bisexual orientation Current Mental Status:  NA (Pt denies) Loss Factors:    Historical Factors:    Risk Reduction Factors:     Total Time spent with patient: 1 hour Principal Problem: Adjustment disorder with mixed disturbance of emotions and conduct Diagnosis:   Patient Active Problem List   Diagnosis Date Noted  . Adjustment disorder with mixed disturbance of emotions and conduct [F43.25] 02/13/2016  . Cannabis use disorder, moderate, dependence (HCC) [F12.20] 02/12/2016  . Opioid use disorder, mild, abuse [F11.10] 02/12/2016  . Pregnant and not yet delivered [Z34.90] 02/12/2016  . Suicide attempt [T14.91XA] 02/12/2016  . Tobacco use disorder [F17.200] 02/12/2016   Subjective Data: suicide attempt.  Continued Clinical Symptoms:  Alcohol Use Disorder Identification Test Final Score (AUDIT): 2 The "Alcohol Use Disorders Identification Test", Guidelines for Use in Primary Care, Second Edition.  World Science writerHealth Organization [facility](WHO). Score between 0-7:  no or low risk or alcohol related problems. Score between 8-15:  moderate risk of alcohol related problems. Score between 16-19:  high risk of alcohol related problems. Score 20 or above:  warrants further diagnostic evaluation for alcohol dependence and treatment.   CLINICAL FACTORS:   Depression:   Comorbid alcohol abuse/dependence Impulsivity Alcohol/Substance Abuse/Dependencies   Musculoskeletal: Strength & Muscle Tone: within normal limits Gait & Station: normal Patient leans: N/A  Psychiatric Specialty Exam: Physical Exam  Nursing note and vitals reviewed.   Review of Systems  Psychiatric/Behavioral: Positive for depression, substance abuse and suicidal ideas.  All other systems reviewed and are negative.   Blood pressure 114/62, pulse (!) 55, temperature 97.9 F (36.6 C), temperature source  Oral, resp. rate 18, height 5\' 7"  (1.702 m), weight 72.6 kg (160 lb), last menstrual period 12/15/2015, unknown if currently breastfeeding.Body mass index is 25.06 kg/m.  See H&P                                                  Sleep:  Number of Hours: 3.5      COGNITIVE FEATURES THAT CONTRIBUTE TO RISK:  None    SUICIDE RISK:   Moderate:  Frequent suicidal ideation with limited intensity, and duration, some specificity in terms of plans, no associated intent, good self-control, limited dysphoria/symptomatology, some risk factors present, and identifiable protective factors, including available and accessible social support.   PLAN OF CARE: hospital admission, medication management, substance abuse counseling, discharge planning.   [name] is a89 year old pregnant female admitted after suicide attempt by jumping out of the in the context of severe social stressors.  1. Suicidal ideation. The patient is able to contract for safety in the hospital.  2. Mood. The patient is uncertain if she needs an antidepressant.  3. Pregnancy. We started prenatal vitamin and folic acid supplementation.  4. Smoking. Nicotine patch is available.  5. Insomnia. Ambien is available.  6. Substance abuse. The patient was positive for cannabinoids and opiates. She minimizes her problems and declines treatment.  7. Disposition. She will be discharged to home. She will follow up with [facility].  I certify that inpatient services furnished can reasonably be expected to improve the patient's condition.  Kristine LineaJolanta Pucilowska, MD 02/13/2016, 9:38 AM

## 2016-02-13 NOTE — Plan of Care (Signed)
Problem: Safety: Goal: Ability to remain free from injury will improve Outcome: Progressing Patient has been free from injury on the unit. Does use a wheelchair/walker for mobility due to fracture to left ankle prior to coming to the unit.

## 2016-02-13 NOTE — Progress Notes (Addendum)
  [facility] Discharge Plan :  Will you be returning to the same living situation after discharge:  Yes,  [facility] significant other At discharge, do you have transportation home?: Yes,  pt will be picke up by her significant other Do you have the ability to pay for your medications: Yes,  [facility]  Release of information consent forms completed and in the chart;  Patient's signature needed at discharge.  Patient to Follow up at: Follow-up Information    [facility] .   Why:  Please arrive to the walk-in clinic between the hours of 8am-2:30pm for an assessment for medication management, substance abuse treatment and therapy.  Please call Unk PintoHarvey Bryant at [phone] for questions and assistance. Contact information: [address] [place] [address] [phone]           Next level of care provider [facility] Link:no  Safety Planning and Suicide Prevention discussed: Yes,  completed with pt  Have you used any form of tobacco in the last 30 days? (Cigarettes, Smokeless Tobacco, Cigars, and/or Pipes): Yes  Has patient been referred to the Quitline?: Patient refused referral  Patient has been referred for addiction treatment: Yes  Dorothe PeaJonathan F Morna Flud 02/13/2016, 2:47 PM

## 2016-02-13 NOTE — [facility] Suicide Risk Assessment (Signed)
[facility] INPATIENT:  Family/Significant Other Suicide Prevention Education  Suicide Prevention Education:  Patient Refusal for Family/Significant Other Suicide Prevention Education: The patient Katherine Hensley has refused to provide written consent for family/significant other to be provided Family/Significant Other Suicide Prevention Education during admission and/or prior to discharge.  Physician notified. CSW completed SPE with the pt.    Dorothe PeaJonathan F Vollie Aaron 02/13/2016, 2:33 PM

## 2016-02-13 NOTE — B[facility] Counselor (Signed)
Pt  was admitted and discharged within a 24 hour period and as such, a PSA is not needed.  Dorothe PeaJonathan F. Hollis Tuller, LCSWA, LCAS  02/13/16

## 2016-02-13 NOTE — Tx Team (Addendum)
Interdisciplinary Treatment and Diagnostic Plan Update  02/13/2016 Time of Session: 10:30am Katherine Reevesorma Goslin MRN: [id number]  Principal Diagnosis: Adjustment disorder with mixed disturbance of emotions and conduct  Secondary Diagnoses: Principal Problem:   Adjustment disorder with mixed disturbance of emotions and conduct Active Problems:   Cannabis use disorder, moderate, dependence (HCC)   Opioid use disorder, mild, abuse   Pregnant and not yet delivered   Suicide attempt   Tobacco use disorder   Current Medications:  Current Facility-Administered Medications  Medication Dose Route Frequency Provider Last Rate Last Dose  . acetaminophen (TYLENOL) tablet 650 mg  650 mg Oral Q6H PRN Shari ProwsJolanta B Pucilowska, MD   650 mg at 02/13/16 0936  . alum & mag hydroxide-simeth (MAALOX/MYLANTA) 200-200-20 MG/5ML suspension 30 mL  30 mL Oral Q4H PRN Jolanta B Pucilowska, MD      . folic acid (FOLVITE) tablet 2 mg  2 mg Oral QAC supper Jolanta B Pucilowska, MD      . magnesium hydroxide (MILK OF MAGNESIA) suspension 30 mL  30 mL Oral Daily PRN Jolanta B Pucilowska, MD      . nicotine (NICODERM CQ - dosed in mg/24 hours) patch 21 mg  21 mg Transdermal Q0600 Jolanta B Pucilowska, MD      . prenatal vitamin w/FE, FA (PRENATAL 1 + 1) 27-1 MG tablet 1 tablet  1 tablet Oral Q1200 Jolanta B Pucilowska, MD      . zolpidem (AMBIEN) tablet 5 mg  5 mg Oral QHS PRN Shari ProwsJolanta B Pucilowska, MD   5 mg at 02/13/16 0144   PTA Medications: Prescriptions Prior to Admission  Medication Sig Dispense Refill Last Dose  . acetaminophen (TYLENOL) 325 MG tablet Take 650 mg by mouth every 6 (six) hours as needed.   prn at prn    Patient Stressors: Financial difficulties Marital or family conflict  Patient Strengths: Ability for insight Average or above average intelligence Financial means Supportive family/friends  Treatment Modalities: Medication Management, Group therapy, Case management,  1 to 1 session with clinician,  Psychoeducation, Recreational therapy.   Physician Treatment Plan for Primary Diagnosis: Adjustment disorder with mixed disturbance of emotions and conduct Long Term Goal(s): Improvement in symptoms so as ready for discharge Improvement in symptoms so as ready for discharge   Short Term Goals: Ability to identify changes in lifestyle to reduce recurrence of condition will improve Ability to verbalize feelings will improve Ability to disclose and discuss suicidal ideas Ability to demonstrate self-control will improve Ability to identify and develop effective coping behaviors will improve Ability to maintain clinical measurements within normal limits will improve Compliance with prescribed medications will improve Ability to identify changes in lifestyle to reduce recurrence of condition will improve Ability to demonstrate self-control will improve Ability to identify triggers associated with substance abuse/mental health issues will improve  Medication Management: Evaluate patient's response, side effects, and tolerance of medication regimen.  Therapeutic Interventions: 1 to 1 sessions, Unit Group sessions and Medication administration.  Evaluation of Outcomes: Adequate for discharge  Physician Treatment Plan for Secondary Diagnosis: Principal Problem:   Adjustment disorder with mixed disturbance of emotions and conduct Active Problems:   Cannabis use disorder, moderate, dependence (HCC)   Opioid use disorder, mild, abuse   Pregnant and not yet delivered   Suicide attempt   Tobacco use disorder  Long Term Goal(s): Improvement in symptoms so as ready for discharge Improvement in symptoms so as ready for discharge   Short Term Goals: Ability to identify changes in lifestyle  to reduce recurrence of condition will improve Ability to verbalize feelings will improve Ability to disclose and discuss suicidal ideas Ability to demonstrate self-control will improve Ability to identify and  develop effective coping behaviors will improve Ability to maintain clinical measurements within normal limits will improve Compliance with prescribed medications will improve Ability to identify changes in lifestyle to reduce recurrence of condition will improve Ability to demonstrate self-control will improve Ability to identify triggers associated with substance abuse/mental health issues will improve     Medication Management: Evaluate patient's response, side effects, and tolerance of medication regimen.  Therapeutic Interventions: 1 to 1 sessions, Unit Group sessions and Medication administration.  Evaluation of Outcomes: Adequate for discharge    RN Treatment Plan for Primary Diagnosis: Adjustment disorder with mixed disturbance of emotions and conduct Long Term Goal(s): Knowledge of disease and therapeutic regimen to maintain health will improve  Short Term Goals: Ability to verbalize frustration and anger appropriately will improve and Ability to identify and develop effective coping behaviors will improve  Medication Management: RN will administer medications as ordered by provider, will assess and evaluate patient's response and provide education to patient for prescribed medication. RN will report any adverse and/or side effects to prescribing provider.  Therapeutic Interventions: 1 on 1 counseling sessions, Psychoeducation, Medication administration, Evaluate responses to treatment, Monitor vital signs and CBGs as ordered, Perform/monitor CIWA, COWS, AIMS and Fall Risk screenings as ordered, Perform wound care treatments as ordered.  Evaluation of Outcomes: Adequate for discharge   LCSW Treatment Plan for Primary Diagnosis: Adjustment disorder with mixed disturbance of emotions and conduct Long Term Goal(s): Safe transition to appropriate next level of care at discharge, Engage patient in therapeutic group addressing interpersonal concerns.  Short Term Goals: Engage patient  in aftercare planning with referrals and resources, Increase social support, Increase ability to appropriately verbalize feelings, Increase emotional regulation, Facilitate acceptance of mental health diagnosis and concerns, Facilitate patient progression through stages of change regarding substance use diagnoses and concerns, Identify triggers associated with mental health/substance abuse issues and Increase skills for wellness and recovery  Therapeutic Interventions: Assess for all discharge needs, 1 to 1 time with Social worker, Explore available resources and support systems, Assess for adequacy in community support network, Educate family and significant other(s) on suicide prevention, Complete Psychosocial Assessment, Interpersonal group therapy.  Evaluation of Outcomes: Adequate for discharge   Progress in Treatment: Attending groups: Yes. Participating in groups: Yes. Taking medication as prescribed: Yes. Toleration medication: Yes. Family/Significant other contact made: CSW contacted pt's significant other Chelsea at ph: [phone] Patient understands diagnosis: Yes. Discussing patient identified problems/goals with staff: Yes. Medical problems stabilized or resolved: Yes. Denies suicidal/homicidal ideation: Yes. Issues/concerns per patient self-inventory: No. Other: none reported  New problem(s) identified: No, Describe:  none listed  New Short Term/Long Term Goal(s):  Discharge Plan or Barriers: Marland Kitchen.[facility] [place] [facility] [place] [facility] and will follow up with [facility] medication management, substance abuse treatment and therapy.      Reason for Continuation of Hospitalization: Anxiety Mania Medical Issues Medication stabilization Suicidal ideation Withdrawal symptoms  Estimated Length of Stay: 3-5 days  Attendees: Patient: Katherine Hensley 02/13/2016 11:00 AM  Physician: Dr. Jennet MaduroPucilowska, MD 02/13/2016 11:00 AM  Nursing: Cheri GuppyGigi Maaniatu 02/13/2016 11:00 AM  RN Care Manager:  02/13/2016 11:00 AM  Social Worker: Dorothe PeaJonathan F. Maddyn Lieurance, LCSWA, LCAS 02/13/2016 11:00 AM  Recreational Therapist:  Hershal CoriaBeth Greene, LRT 02/13/2016 11:00 AM  Other:  02/13/2016 11:00 AM  Other:  02/13/2016 11:00 AM  Other: 02/13/2016 11:00 AM    Scribe for Treatment Team: Mercy Riding, LCSWA 02/13/2016

## 2016-02-13 NOTE — Tx Team (Signed)
Initial Treatment Plan 02/13/2016 5:38 AM Katherine Hensley AVW:[id number]    PATIENT STRESSORS: Financial difficulties Marital or family conflict   PATIENT STRENGTHS: Ability for insight Average or above average intelligence Financial means Supportive family/friends   PATIENT IDENTIFIED PROBLEMS: Argument with ex-girlfriend  "I just want to leave."                    DISCHARGE CRITERIA:  Ability to meet basic life and health needs Improved stabilization in mood, thinking, and/or behavior  PRELIMINARY DISCHARGE PLAN: Outpatient therapy  PATIENT/FAMILY INVOLVEMENT: This treatment plan has been presented to and reviewed with the patient, Katherine Hensley.  The patient and family have been given the opportunity to ask questions and make suggestions.  Katherine NeighboursLuke B Jerin Franzel, RN 02/13/2016, 5:38 AM
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
## 2016-02-13 NOTE — [facility] Suicide Risk Assessment (Signed)
[facility]   Principal Problem: Adjustment disorder with mixed disturbance of emotions and conduct Discharge Diagnoses:  Patient Active Problem List   Diagnosis Date Noted  . Adjustment disorder with mixed disturbance of emotions and conduct [F43.25] 02/13/2016  . Cannabis use disorder, moderate, dependence (HCC) [F12.20] 02/12/2016  . Opioid use disorder, mild, abuse [F11.10] 02/12/2016  . Pregnant and not yet delivered [Z34.90] 02/12/2016  . Suicide attempt [T14.91XA] 02/12/2016  . Tobacco use disorder [F17.200] 02/12/2016    Total Time spent with patient: 1 hour  Musculoskeletal: Strength & Muscle Tone: within normal limits Gait & Station: normal Patient leans: N/A  Psychiatric Specialty Exam: Review of Systems  Musculoskeletal:       Foot pain  All other systems reviewed and are negative.   Blood pressure 114/62, pulse (!) 55, temperature 97.9 F (36.6 C), temperature source Oral, resp. rate 18, height 5\' 7"  (1.702 m), weight 72.6 kg (160 lb), last menstrual period 12/15/2015, unknown if currently breastfeeding.Body mass index is 25.06 kg/m.  General Appearance: Casual  Eye Contact::  Good  Speech:  Clear and Coherent409  Volume:  Normal  Mood:  Euthymic  Affect:  Appropriate  Thought Process:  Goal Directed and Descriptions of Associations: Intact  Orientation:  Full (Time, Place, and Person)  Thought Content:  WDL  Suicidal Thoughts:  No  Homicidal Thoughts:  No  Memory:  Immediate;   Fair Recent;   Fair Remote;   Fair  Judgement:  Impaired  Insight:  Fair  Psychomotor Activity:  Normal  Concentration:  Fair  Recall:  [employer]Fair  Fund of Knowledge:Fair  Language: Fair  Akathisia:  No  Handed:  Right  AIMS (if indicated):     Assets:  Communication Skills Desire for Improvement Financial Resources/Insurance Housing Physical Health Resilience Social Support  Sleep:  Number of Hours: 3.5  Cognition: WNL  ADL's:  Intact   Mental  Status Per Nursing Assessment::   On Admission:  NA (Pt denies)  Demographic Factors:  Gay, lesbian, or bisexual orientation  Loss Factors: Loss of significant relationship  Historical Factors: Impulsivity  Risk Reduction Factors:   Responsible for children under 30 years of age, Sense of responsibility to family, Employed, Living with another person, especially a relative and Positive social support  Continued Clinical Symptoms:  Depression:   Comorbid alcohol abuse/dependence Impulsivity  Cognitive Features That Contribute To Risk:  None    Suicide Risk:  Minimal: No identifiable suicidal ideation.  Patients presenting with no risk factors but with morbid ruminations; may be classified as minimal risk based on the severity of the depressive symptoms    Plan Of Care/Follow-up recommendations:  Activity:  As tolerated. Diet:  Regular. Other:  Keep follow-up appointments.  Kristine LineaJolanta Pucilowska, MD 02/13/2016, 2:01 PM
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
## 2016-02-13 NOTE — Progress Notes (Signed)
[facility] Abrasions on both L and R knee. Small abrasion on R pinky toe. Scarred over lacerations on both left and right leg. Patient state it was from years ago d/t climbing fences. No contraband found.

## 2016-02-13 NOTE — Progress Notes (Signed)
Patient discharged with transition record, suicide risk assessment, and discharge summary with instructions and follow-up. Belongings to patient. Discharged via wheelchair to significant other.

## 2016-02-13 NOTE — Plan of Care (Signed)
Problem: Coping: Goal: Ability to demonstrate self-control will improve Outcome: Progressing Pt demonstrated adequate self-control this evening.

## 2016-02-15 ENCOUNTER — Emergency Department
Admission: EM | Admit: 2016-02-15 | Discharge: 2016-02-15 | Disposition: A | Discharge status: Home / Self Care | Payer: Medicaid Other | Attending: Emergency Medicine | Admitting: Emergency Medicine

## 2016-02-15 DIAGNOSIS — F4320 Adjustment disorder, unspecified: Secondary | ICD-10-CM | POA: Insufficient documentation

## 2016-02-15 DIAGNOSIS — S82892S Other fracture of left lower leg, sequela: Secondary | ICD-10-CM | POA: Diagnosis not present

## 2016-02-15 DIAGNOSIS — F1110 Opioid abuse, uncomplicated: Secondary | ICD-10-CM | POA: Diagnosis not present

## 2016-02-15 DIAGNOSIS — O99341 Other mental disorders complicating pregnancy, first trimester: Secondary | ICD-10-CM | POA: Diagnosis not present

## 2016-02-15 DIAGNOSIS — O99331 Smoking (tobacco) complicating pregnancy, first trimester: Secondary | ICD-10-CM | POA: Diagnosis not present

## 2016-02-15 DIAGNOSIS — Z791 Long term (current) use of non-steroidal anti-inflammatories (NSAID): Secondary | ICD-10-CM | POA: Insufficient documentation

## 2016-02-15 DIAGNOSIS — O9A211 Injury, poisoning and certain other consequences of external causes complicating pregnancy, first trimester: Secondary | ICD-10-CM | POA: Insufficient documentation

## 2016-02-15 DIAGNOSIS — F17290 Nicotine dependence, other tobacco product, uncomplicated: Secondary | ICD-10-CM | POA: Insufficient documentation

## 2016-02-15 DIAGNOSIS — O99321 Drug use complicating pregnancy, first trimester: Secondary | ICD-10-CM | POA: Diagnosis not present

## 2016-02-15 DIAGNOSIS — F1220 Cannabis dependence, uncomplicated: Secondary | ICD-10-CM | POA: Diagnosis not present

## 2016-02-15 DIAGNOSIS — Z3A00 Weeks of gestation of pregnancy not specified: Secondary | ICD-10-CM | POA: Insufficient documentation

## 2016-02-15 DIAGNOSIS — X58XXXS Exposure to other specified factors, sequela: Secondary | ICD-10-CM | POA: Diagnosis not present

## 2016-02-15 NOTE — Discharge Instructions (Signed)
Ambulating with support until evaluation by orthopedic clinic

## 2016-02-15 NOTE — ED Triage Notes (Signed)
Pt to ER requesting work note because she is unable to go back to work today due to ankle injury. Pt seen in ER initially for ankle injury. Using walker to ambulate. Has not followed up with ortho.

## 2016-02-15 NOTE — ED Provider Notes (Signed)
[facility] Emergency Department Provider Note   ____________________________________________   First MD Initiated Contact with Patient 02/15/16 1333     (approximate)  I have reviewed the triage vital signs and the nursing notes.   HISTORY  Chief Complaint Letter for School/Work    HPI Katherine Hensley is a 30 y.o. female patient here today for medical clearance score back to work secondary to a fractured ankle. Patient initially was seen in ER and does not have an orthopedic appointment until 02/22/2016. Patient state that the child will accommodate light duty. Patient continue to wear posterior splint applied initial ER visit. Patient ambulates with the use of a walker.Patient rates the pain as a 5/10 patient state increases to 8/10 with prolonged standing. Patient described a pain as "achy". Patient states transient relief with anti-inflammatory medication. Patient requesting additional medications this time. Patient proximal 7-8 weeks' gestation.   Past Medical History:  Diagnosis Date  . Sickle cell anemia [facility](HCC)     Patient Active Problem List   Diagnosis Date Noted  . 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 Cannabis use disorder, moderate, dependence (HCC) 02/12/2016  . Opioid use disorder, mild, abuse 02/12/2016  . Pregnant and not yet delivered 02/12/2016  . Suicide attempt 02/12/2016  . Tobacco use disorder 02/12/2016    Past Surgical History:  Procedure Laterality Date  . CESAREAN SECTION      Prior to Admission medications   Medication Sig Start Date End Date Taking? Authorizing Provider  acetaminophen (TYLENOL) 325 MG tablet Take 650 mg by mouth every 6 (six) hours as needed.    Historical Provider, MD  folic acid (FOLVITE) 1 MG tablet Take 2 tablets (2 mg total) by mouth daily before supper. 02/13/16   Shari ProwsJolanta B Pucilowska, MD  prenatal vitamin w/FE, FA (PRENATAL 1 + 1) 27-1 MG TABS tablet Take  1 tablet by mouth daily at 12 noon. 02/14/16   Shari ProwsJolanta B Pucilowska, MD    Allergies Review of patient's allergies indicates no known allergies.  No family history on file.  Social History Social History  Substance Use Topics  . Smoking status: Current Every Day Smoker    Types: Cigars  . Smokeless tobacco: Never Used  . Alcohol use Yes     Comment: occas    Review of Systems Constitutional: No fever/chills Eyes: No visual changes. ENT: No sore throat. Cardiovascular: Denies chest pain. Respiratory: Denies shortness of breath. Gastrointestinal: No abdominal pain.  No nausea, no vomiting.  No diarrhea.  No constipation. Genitourinary: Negative for dysuria. Musculoskeletal: Left ankle pain Skin: Negative for rash. Neurological: Negative for headaches, focal weakness or numbness. Psychiatric:Adjustment disorder, cannabis dependency and opiate abuse. Suicidal attempt Hematological/Lymphatic:Sickle cell anemia ___________________________________________   PHYSICAL EXAM:  VITAL SIGNS: ED Triage Vitals  Enc Vitals Group     BP 02/15/16 1307 113/64     Pulse Rate 02/15/16 1307 (!) 58     Resp 02/15/16 1307 18     Temp 02/15/16 1307 99 F (37.2 C)     Temp Source 02/15/16 1307 Oral     SpO2 02/15/16 1307 96 %     Weight 02/15/16 1308 160 lb (72.6 kg)     Height 02/15/16 1308 5\' 7"  (1.702 m)     Head Circumference --      Peak Flow --      Pain Score 02/15/16 1308 5     Pain Loc --  Pain Edu? --      Excl. in GC? --     Constitutional: Alert and oriented. Well appearing and in no acute distress. Eyes: Conjunctivae are normal. PERRL. EOMI. Head: Atraumatic. Nose: No congestion/rhinnorhea. Mouth/Throat: Mucous membranes are moist.  Oropharynx non-erythematous. Neck: No stridor. No cervical spine tenderness to palpation. Hematological/Lymphatic/Immunilogical: No cervical lymphadenopathy. Cardiovascular: Normal rate, regular rhythm. Grossly normal heart sounds.   Good peripheral circulation. Respiratory: Normal respiratory effort.  No retractions. Lungs CTAB. Gastrointestinal: Soft and nontender. No distention. No abdominal bruits. No CVA tenderness. Musculoskeletal: Patient is wearing posterior ankle splint.  Neurologic:  Normal speech and language. No gross focal neurologic deficits are appreciated. No gait instability. Skin:  Skin is warm, dry and intact. No rash noted. Psychiatric: Mood and affect are normal. Speech and behavior are normal.  ____________________________________________   LABS (all labs ordered are listed, but only abnormal results are displayed)  Labs Reviewed - No data to display ____________________________________________  EKG   ____________________________________________  RADIOLOGY   ____________________________________________   PROCEDURES  Procedure(s) performed: None  Procedures  Critical Care performed: No  ____________________________________________   INITIAL IMPRESSION / ASSESSMENT AND PLAN / ED COURSE  Pertinent labs & imaging results that were available during my care of the patient were reviewed by me and considered in my medical decision making (see chart for details).  Left ankle fracture. Patient given restrictive work limitation until evaluation by orthopedics. Patient advised to continue ambulating with supportive. No prolonged standing for one week. Continue previous medications.  Clinical Course     ____________________________________________   FINAL CLINICAL IMPRESSION(S) / ED DIAGNOSES  Final diagnoses:  Closed left ankle fracture, sequela      NEW MEDICATIONS STARTED DURING THIS VISIT:  New Prescriptions   No medications on file     Note:  This document was prepared using Dragon voice recognition software and may include unintentional dictation errors.    Joni ReiningRonald K Smith, PA-C 02/15/16 1346    Sharman CheekPhillip Stafford, MD 02/16/16 [phone]

## 2016-05-09 ENCOUNTER — Emergency Department: Payer: Medicaid Other

## 2016-05-09 ENCOUNTER — Encounter: Payer: Self-pay | Admitting: Emergency Medicine

## 2016-05-09 ENCOUNTER — Emergency Department
Admission: EM | Admit: 2016-05-09 | Discharge: 2016-05-09 | Disposition: A | Discharge status: Home / Self Care | Payer: Medicaid Other | Attending: Emergency Medicine | Admitting: Emergency Medicine

## 2016-05-09 DIAGNOSIS — Y999 Unspecified external cause status: Secondary | ICD-10-CM | POA: Diagnosis not present

## 2016-05-09 DIAGNOSIS — O26892 Other specified pregnancy related conditions, second trimester: Secondary | ICD-10-CM

## 2016-05-09 DIAGNOSIS — R109 Unspecified abdominal pain: Secondary | ICD-10-CM | POA: Diagnosis not present

## 2016-05-09 DIAGNOSIS — O9A212 Injury, poisoning and certain other consequences of external causes complicating pregnancy, second trimester: Secondary | ICD-10-CM | POA: Insufficient documentation

## 2016-05-09 DIAGNOSIS — W19XXXA Unspecified fall, initial encounter: Secondary | ICD-10-CM

## 2016-05-09 DIAGNOSIS — Y9302 Activity, running: Secondary | ICD-10-CM | POA: Insufficient documentation

## 2016-05-09 DIAGNOSIS — W01198A Fall on same level from slipping, tripping and stumbling with subsequent striking against other object, initial encounter: Secondary | ICD-10-CM | POA: Insufficient documentation

## 2016-05-09 DIAGNOSIS — Z3A19 19 weeks gestation of pregnancy: Secondary | ICD-10-CM | POA: Insufficient documentation

## 2016-05-09 DIAGNOSIS — O99332 Smoking (tobacco) complicating pregnancy, second trimester: Secondary | ICD-10-CM | POA: Insufficient documentation

## 2016-05-09 DIAGNOSIS — Y92002 Bathroom of unspecified non-institutional (private) residence single-family (private) house as the place of occurrence of the external cause: Secondary | ICD-10-CM | POA: Diagnosis not present

## 2016-05-09 DIAGNOSIS — F17290 Nicotine dependence, other tobacco product, uncomplicated: Secondary | ICD-10-CM | POA: Insufficient documentation

## 2016-05-09 LAB — POCT PREGNANCY, URINE: Preg Test, Ur: POSITIVE — AB

## 2016-05-09 LAB — URINALYSIS, COMPLETE (UACMP) WITH MICROSCOPIC
Bilirubin Urine: NEGATIVE
Glucose, UA: NEGATIVE mg/dL
Hgb urine dipstick: NEGATIVE
KETONES UR: NEGATIVE mg/dL
Leukocytes, UA: NEGATIVE
Nitrite: NEGATIVE
PH: 7 (ref 5.0–8.0)
Protein, ur: NEGATIVE mg/dL
SPECIFIC GRAVITY, URINE: 1.017 (ref 1.005–1.030)

## 2016-05-09 MED ORDER — ACETAMINOPHEN 500 MG PO TABS
1000.0000 mg | ORAL_TABLET | Freq: Once | ORAL | Status: AC
  Administered 2016-05-09: 1000 mg via ORAL
  Filled 2016-05-09: qty 2

## 2016-05-09 NOTE — ED Provider Notes (Signed)
[facility] Emergency Department Provider Note        Time seen: ----------------------------------------- 7:20 AM on 05/09/2016 -----------------------------------------    I have reviewed the triage vital signs and the nursing notes.   HISTORY  Chief Complaint Fall    HPI Katherine Hensley is a 31 y.o. female who presents to the ER after she was walking to the bathroom and she ran to the bathroom door. This caused her to fall and land on her left side. She is complaining of some abdominal pain since the fall. She denies any vaginal bleeding or leakage of fluid or other complaints. Patient states she is [redacted] weeks pregnant.    Past Medical History:  Diagnosis Date  . Sickle cell anemia [facility](HCC)     Patient Active Problem List   Diagnosis Date Noted  . 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 Cannabis use disorder, moderate, dependence (HCC) 02/12/2016  . Opioid use disorder, mild, abuse 02/12/2016  . Pregnant and not yet delivered 02/12/2016  . Suicide attempt 02/12/2016  . Tobacco use disorder 02/12/2016    Past Surgical History:  Procedure Laterality Date  . CESAREAN SECTION      Allergies Patient has no known allergies.  Social History Social History  Substance Use Topics  . Smoking status: Current Every Day Smoker    Types: Cigars  . Smokeless tobacco: Never Used  . Alcohol use Yes     Comment: occas    Review of Systems Constitutional: Negative for fever. Cardiovascular: Negative for chest pain. Respiratory: Negative for shortness of breath. Gastrointestinal: Positive for abdominal pain Genitourinary: Negative for dysuria. Musculoskeletal: Negative for back pain. Skin: Negative for rash. Neurological: Negative for headaches, focal weakness or numbness.  10-point ROS otherwise negative.  ____________________________________________   PHYSICAL EXAM:  VITAL SIGNS: ED Triage Vitals  [05/09/16 0656]  Enc Vitals Group     BP 105/81     Pulse Rate 72     Resp 15     Temp 98.2 F (36.8 C)     Temp Source Oral     SpO2 100 %     Weight 172 lb (78 kg)     Height 5\' 7"  (1.702 m)     Head Circumference      Peak Flow      Pain Score      Pain Loc      Pain Edu?      Excl. in GC?     Constitutional: Alert and oriented. Well appearing and in no distress.Patient tearful Eyes: Conjunctivae are normal. PERRL. Normal extraocular movements. ENT   Head: Normocephalic and atraumatic.   Nose: No congestion/rhinnorhea.   Mouth/Throat: Mucous membranes are moist.   Neck: No stridor. Cardiovascular: Normal rate, regular rhythm. No murmurs, rubs, or gallops. Respiratory: Normal respiratory effort without tachypnea nor retractions. Breath sounds are clear and equal bilaterally. No wheezes/rales/rhonchi. Gastrointestinal: Soft and nontender. Normal bowel sounds, gravid uterus Musculoskeletal: Nontender with normal range of motion in all extremities. No lower extremity tenderness nor edema. Neurologic:  Normal speech and language. No gross focal neurologic deficits are appreciated.  Skin:  Skin is warm, dry and intact. No rash noted. Psychiatric: Depressed mood and affect ____________________________________________  ED COURSE:  Pertinent labs & imaging results that were available during my care of the patient were reviewed by me and considered in my medical decision making (see chart for details). Patient presents to the ER sobbing from a fall from  standing position. We will assess with ultrasound and reevaluate.   Procedures ____________________________________________   LABS (pertinent positives/negatives)  Labs Reviewed  URINALYSIS, COMPLETE (UACMP) WITH MICROSCOPIC - Abnormal; Notable for the following:       Result Value   Color, Urine YELLOW (*)    APPearance CLEAR (*)    Bacteria, UA RARE (*)    Squamous Epithelial / LPF 0-5 (*)    All other  components within normal limits  POCT PREGNANCY, URINE - Abnormal; Notable for the following:    Preg Test, Ur POSITIVE (*)    All other components within normal limits    RADIOLOGY Images were viewed by me  Ultrasound OB limited IMPRESSION: Single viable intrauterine pregnancy at 19 weeks 5 days. Amniotic fluid volume appears subjectively low. AFI 10.9 cm.  This exam is performed on an emergent basis and does not comprehensively evaluate fetal size, dating, or anatomy; follow-up complete OB US should be considered if further fetal assessment is warranted. ____________________________________________  FINAL ASSESSMENT AND PLAN  Fall, abdominal pain, second trimester pregnancy  Plan: Patient with labs and imaging as dictated above. Patient presents to ER with abdominal pain after a fall from standing position. Ultrasound reveals slightly low amniotic fluid volume but otherwise normal appearing ultrasound. She will need close outpatient follow-up with OB/GYN.   Emily Filbert, MD   Note: This note was generated in part or whole with voice recognition software. Voice recognition is usually quite accurate but there are transcription errors that can and very often do occur. I apologize for any typographical errors that were not detected and corrected.     Emily Filbert, MD 05/09/16 [phone]

## 2016-05-09 NOTE — ED Triage Notes (Signed)
Pt ambulatory to triage with steady slow gait. Pt reports she ran into her bathroom door this AM, causing her to trip and fall onto her side. Pt is c/o of abdominal pain since fall. Pt is [redacted] weeks pregnant.

## 2016-05-09 NOTE — ED Notes (Signed)
AAOx3.  Skin warm and dry.  Ambulates with easy and steady gait. NAD 

## 2016-05-26 ENCOUNTER — Encounter: Payer: Self-pay | Admitting: Emergency Medicine

## 2016-05-26 ENCOUNTER — Emergency Department
Admission: EM | Admit: 2016-05-26 | Discharge: 2016-05-26 | Disposition: A | Discharge status: Home / Self Care | Payer: Medicaid Other | Attending: Emergency Medicine | Admitting: Emergency Medicine

## 2016-05-26 DIAGNOSIS — Z79899 Other long term (current) drug therapy: Secondary | ICD-10-CM | POA: Diagnosis not present

## 2016-05-26 DIAGNOSIS — O99332 Smoking (tobacco) complicating pregnancy, second trimester: Secondary | ICD-10-CM | POA: Diagnosis not present

## 2016-05-26 DIAGNOSIS — J111 Influenza due to unidentified influenza virus with other respiratory manifestations: Secondary | ICD-10-CM

## 2016-05-26 DIAGNOSIS — F17290 Nicotine dependence, other tobacco product, uncomplicated: Secondary | ICD-10-CM | POA: Insufficient documentation

## 2016-05-26 DIAGNOSIS — Z3A21 21 weeks gestation of pregnancy: Secondary | ICD-10-CM | POA: Diagnosis not present

## 2016-05-26 DIAGNOSIS — O99512 Diseases of the respiratory system complicating pregnancy, second trimester: Secondary | ICD-10-CM | POA: Insufficient documentation

## 2016-05-26 LAB — URINALYSIS, COMPLETE (UACMP) WITH MICROSCOPIC
Bacteria, UA: NONE SEEN
Bilirubin Urine: NEGATIVE
GLUCOSE, UA: NEGATIVE mg/dL
Hgb urine dipstick: NEGATIVE
Ketones, ur: 80 mg/dL — AB
LEUKOCYTES UA: NEGATIVE
Nitrite: NEGATIVE
PH: 6 (ref 5.0–8.0)
Protein, ur: 30 mg/dL — AB
SPECIFIC GRAVITY, URINE: 1.026 (ref 1.005–1.030)

## 2016-05-26 MED ORDER — OSELTAMIVIR PHOSPHATE 75 MG PO CAPS: 75.0000 mg | ORAL_CAPSULE | Freq: Two times a day (BID) | ORAL | 0 refills | Status: AC

## 2016-05-26 MED ORDER — ONDANSETRON 4 MG PO TBDP: 4.0000 mg | ORAL_TABLET | Freq: Four times a day (QID) | ORAL | 0 refills | Status: DC | PRN

## 2016-05-26 MED ORDER — ONDANSETRON 4 MG PO TBDP
4.0000 mg | ORAL_TABLET | Freq: Once | ORAL | Status: AC
  Administered 2016-05-26: 4 mg via ORAL
  Filled 2016-05-26: qty 1

## 2016-05-26 MED ORDER — FLUTICASONE PROPIONATE 50 MCG/ACT NA SUSP: 2.0000 | Freq: Every day | NASAL | 0 refills | Status: DC

## 2016-05-26 MED ORDER — ACETAMINOPHEN 500 MG PO TABS
1000.0000 mg | ORAL_TABLET | Freq: Once | ORAL | Status: AC
  Administered 2016-05-26: 1000 mg via ORAL
  Filled 2016-05-26: qty 2

## 2016-05-26 NOTE — ED Provider Notes (Signed)
[facility] Emergency Department Provider Note ____________________________________________  Time seen: 1012  I have reviewed the triage vital signs and the nursing notes.  HISTORY  Chief Complaint  Fever; Generalized Body Aches; and Cough  HPI Katherine Hensley is a 31 y.o. female [redacted] weeks gestation, presents to the ED for evaluation of flulike symptoms. Patient describes onset of symptoms 2 days prior. Since that time she's had cough, body aches, fever, and nausea.He has only dosed Tylenol for her symptoms, she is unaware of what other medications are safe for symptoms to her pregnancy. She denies any abnormal vaginal bleeding. She notes some pain to the bilateral lower ribs, fatigue, and inability to keep down fluids. She does report normal urinary output. She did not receive the seasonal flu vaccine.  Past Medical History:  Diagnosis Date  . Sickle cell anemia [facility])     Patient Active Problem List   Diagnosis Date Noted  . 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 Cannabis use disorder, moderate, dependence (HCC) 02/12/2016  . Opioid use disorder, mild, abuse 02/12/2016  . Pregnant and not yet delivered 02/12/2016  . Suicide attempt 02/12/2016  . Tobacco use disorder 02/12/2016    Past Surgical History:  Procedure Laterality Date  . CESAREAN SECTION      Prior to Admission medications   Medication Sig Start Date End Date Taking? Authorizing Provider  acetaminophen (TYLENOL) 325 MG tablet Take 650 mg by mouth every 6 (six) hours as needed.    Historical Provider, MD  ferrous sulfate (FERROUSUL) 325 (65 FE) MG tablet Take 325 mg by mouth daily.    Historical Provider, MD  fluticasone (FLONASE) 50 MCG/ACT nasal spray Place 2 sprays into both nostrils daily. 05/26/16   Danylle Ouk V Bacon Carlyne Keehan, PA-C  folic acid (FOLVITE) 1 MG tablet Take 2 tablets (2 mg total) by mouth daily before supper. 02/13/16   Shari Prows, MD   ondansetron (ZOFRAN ODT) 4 MG disintegrating tablet Take 1 tablet (4 mg total) by mouth every 6 (six) hours as needed for nausea or vomiting. 05/26/16   Charlesetta Ivory Karstyn Birkey, PA-C  oseltamivir (TAMIFLU) 75 MG capsule Take 1 capsule (75 mg total) by mouth 2 (two) times daily. 05/26/16 05/31/16  Charlesetta Ivory Torben Soloway, PA-C  prenatal vitamin w/FE, FA (PRENATAL 1 + 1) 27-1 MG TABS tablet Take 1 tablet by mouth daily at 12 noon. 02/14/16   Shari Prows, MD  sertraline (ZOLOFT) 50 MG tablet Take 50 mg by mouth daily. 04/19/16   Historical Provider, MD    Allergies Patient has no known allergies.  No family history on file.  Social History Social History  Substance Use Topics  . Smoking status: Current Every Day Smoker    Types: Cigars  . Smokeless tobacco: Never Used  . Alcohol use Yes     Comment: occas    Review of Systems  Constitutional: Positive for fever. Eyes: Negative for visual changes. ENT: Negative for sore throat. Cardiovascular: Negative for chest pain. Respiratory: Negative for shortness of breath. Reports nonproductive cough. Gastrointestinal: Negative for abdominal pain, vomiting and diarrhea. Reports nausea. Genitourinary: Negative for dysuria. Musculoskeletal: Negative for back pain. Reports generalized bodyaches. Skin: Negative for rash. Neurological: Negative for headaches, focal weakness or numbness. ____________________________________________  PHYSICAL EXAM:  VITAL SIGNS: ED Triage Vitals  Enc Vitals Group     BP 05/26/16 0849 (!) 115/55     Pulse Rate 05/26/16 0849 95  Resp 05/26/16 0849 18     Temp 05/26/16 0849 100.1 F (37.8 C)     Temp Source 05/26/16 0849 Oral     SpO2 05/26/16 0849 98 %     Weight --      Height --      Head Circumference --      Peak Flow --      Pain Score 05/26/16 0845 8     Pain Loc --      Pain Edu? --      Excl. in GC? --     Constitutional: Alert and oriented. Well appearing and in no distress. Head:  Normocephalic and atraumatic. Eyes: Conjunctivae are normal. PERRL. Normal extraocular movements Ears: Canals clear. TMs intact bilaterally. Nose: No congestion/rhinorrhea/epistaxis. Mouth/Throat: Mucous membranes are moist. Uvula is midline and tonsils are flat. Tonsils without erythema, edema, or exudate. Neck: Supple. No thyromegaly. Hematological/Lymphatic/Immunological: No cervical lymphadenopathy. Cardiovascular: Normal rate, regular rhythm. Normal distal pulses. Respiratory: Normal respiratory effort. No wheezes/rales/rhonchi. Gastrointestinal: Rapid, soft and nontender. No distention, rebound, or guarding. Normoactive bowel sounds. No CVA tenderness noted. Musculoskeletal: Nontender with normal range of motion in all extremities.  Neurologic:  Normal gait without ataxia. Normal speech and language. No gross focal neurologic deficits are appreciated. Skin:  Skin is warm, dry and intact. No rash noted. ____________________________________________   LABS (pertinent positives/negatives)  Labs Reviewed  URINALYSIS, COMPLETE (UACMP) WITH MICROSCOPIC - Abnormal; Notable for the following:       Result Value   Color, Urine YELLOW (*)    APPearance CLEAR (*)    Ketones, ur 80 (*)    Protein, ur 30 (*)    Squamous Epithelial / LPF 0-5 (*)    All other components within normal limits  ____________________________________________  PROCEDURES  Zofran 4 mg ODT Tylenol 1000 mg PO ____________________________________________  INITIAL IMPRESSION / ASSESSMENT AND PLAN / ED COURSE  Pregnant patient [redacted] weeks gestation, with presentation of flulike symptoms. She is discharged at this time after benign exam, with instructions on management of her symptoms. She is advised of several medications that are safe to dose during pregnancy, including, Benadryl, Claritin, Flonase, Delsym, and Tylenol. She is provided with the patient for Flonase, Zofran, and Tamiflu. She is encouraged to maintain  continued to push fluids to be due to stress and dehydration. She is also advised to avoid anti-inflammatories and decongestants. She will follow with her primary care provider for ongoing symptom management. Return to the ED for acutely worsening symptoms as discussed. ____________________________________________  FINAL CLINICAL IMPRESSION(S) / ED DIAGNOSES  Final diagnoses:  Influenza      Lissa HoardJenise V Bacon Daliya Parchment, PA-C 05/26/16 1834    Governor Rooksebecca Lord, MD 05/28/16 2250

## 2016-05-26 NOTE — ED Notes (Signed)
NAD noted at time of D/C. Pt denies questions or concerns. Pt ambulatory to the lobby at this time.  

## 2016-05-26 NOTE — ED Triage Notes (Signed)
C/O cough, body aches, fever, and nausea.  Onset of symptoms Friday.  Last medicated for fever yesterday evening. Patient is [redacted] weeks pregnant.  EDC: 09/30/16.  P4 G3

## 2016-05-26 NOTE — Discharge Instructions (Signed)
Your symptoms are consistent with influenza (flu). You should continue to rest and hydrate. Take the anti-nausea medicine to allow you to eat and drink to avoid dehydration. Take the Tamiflu as directed for shortening the duration of your flu. You may take OTC Delsym, Benadryl/Claritin, Allegra/Zyrtec, Tylenol, or nasal saline; safely during pregnancy. Avoid decongestants (Sudafed, Sudafed PE, phenylephrine) and anti-inflammatories (Ibuprofen, naproxen, Motrin, Advil, Aleve). See your OB provider as needed.

## 2016-06-15 ENCOUNTER — Encounter: Payer: Self-pay | Admitting: *Deleted

## 2016-06-15 ENCOUNTER — Observation Stay
Admission: EM | Admit: 2016-06-15 | Discharge: 2016-06-15 | Disposition: A | Discharge status: Home / Self Care | Payer: Medicaid Other | Attending: Obstetrics and Gynecology | Admitting: Obstetrics and Gynecology

## 2016-06-15 DIAGNOSIS — O26852 Spotting complicating pregnancy, second trimester: Secondary | ICD-10-CM | POA: Diagnosis present

## 2016-06-15 DIAGNOSIS — Z3A24 24 weeks gestation of pregnancy: Secondary | ICD-10-CM | POA: Diagnosis not present

## 2016-06-15 DIAGNOSIS — D573 Sickle-cell trait: Secondary | ICD-10-CM | POA: Diagnosis not present

## 2016-06-15 DIAGNOSIS — N939 Abnormal uterine and vaginal bleeding, unspecified: Secondary | ICD-10-CM | POA: Diagnosis present

## 2016-06-15 DIAGNOSIS — O99012 Anemia complicating pregnancy, second trimester: Secondary | ICD-10-CM | POA: Diagnosis not present

## 2016-06-15 HISTORY — DX: Anemia, unspecified: D64.9

## 2016-06-15 HISTORY — DX: Nontoxic goiter, unspecified: E04.9

## 2016-06-15 HISTORY — DX: Unspecified asthma, uncomplicated: J45.909

## 2016-06-15 LAB — URINALYSIS, COMPLETE (UACMP) WITH MICROSCOPIC
BILIRUBIN URINE: NEGATIVE
Bacteria, UA: NONE SEEN
Glucose, UA: NEGATIVE mg/dL
Hgb urine dipstick: NEGATIVE
Ketones, ur: NEGATIVE mg/dL
Leukocytes, UA: NEGATIVE
Nitrite: NEGATIVE
PH: 6 (ref 5.0–8.0)
Protein, ur: 30 mg/dL — AB
SPECIFIC GRAVITY, URINE: 1.028 (ref 1.005–1.030)

## 2016-06-15 LAB — CBC
HCT: 24.5 % — ABNORMAL LOW (ref 35.0–47.0)
Hemoglobin: 8.2 g/dL — ABNORMAL LOW (ref 12.0–16.0)
MCH: 24 pg — AB (ref 26.0–34.0)
MCHC: 33.2 g/dL (ref 32.0–36.0)
MCV: 72.2 fL — ABNORMAL LOW (ref 80.0–100.0)
PLATELETS: 204 10*3/uL (ref 150–440)
RBC: 3.4 MIL/uL — AB (ref 3.80–5.20)
RDW: 14.4 % (ref 11.5–14.5)
WBC: 8.2 10*3/uL (ref 3.6–11.0)

## 2016-06-15 MED ORDER — FERROUS SULFATE 325 (65 FE) MG PO TABS
325.0000 mg | ORAL_TABLET | Freq: Once | ORAL | Status: AC
Start: 2016-06-15 — End: 2016-06-15
  Administered 2016-06-15: 325 mg via ORAL
  Filled 2016-06-15: qty 1

## 2016-06-15 NOTE — OB Triage Note (Signed)
Spotting when went to the bathroom this morning around 1100. One occurrence. Brown discharge noted when wiped. Denies ctx. Elaina HoopsElks, Ladanian Kelter S

## 2016-06-15 NOTE — Progress Notes (Addendum)
Patient ID: Katherine Hensley, female   DOB: 04/15/1985, 31 y.o.   MRN: [id number] Katherine Reevesorma Naas 12/18/1985 G5 P3 4138w5d presents for vaginal spotting and bladder pressure x 1 dya. C/S x 3  Last child delivered at ?? Premature 35 week ( records are conflicting ) . No recent intercourse . Anatamic u/s shows posterior placenta.No N/V or truama PMHX : sickle trait , enlarged thyroid , nl tsh  PSHX: c/s x 3 LMP 12/25/15 EDC 09/28/16 WUJ:[id number]:GENERAL REVIEW OF SYSTEMS:  History obtained from the patient General ROS: positive for - sleep disturbance Psychological ROS: negative Ophthalmic ROS: negative ENT ROS: negative Allergy and Immunology ROS: negative Hematological and Lymphatic ROS: negative Endocrine ROS: positive for - weight flucuates. Breast ROS: negative Respiratory ROS: positive for - occasional wheezing secondary to asthma.not severe patient. Has nebulizer machine for as needed treatment.  Cardiovascular ROS: no chest pain or dyspnea on exertion Genito-Urinary ROS: no dysuria, trouble voiding, or hematuria Musculoskeletal ROS: negative Neurological ROS: no TIA or stroke symptoms Dermatological ROS: negative   Social + MJ in past + UDS , no tobacco , no ETOH  Allergies : NKDA FHX : + brother with mental retardation    , O;BP 118/68 (BP Location: Left Arm)   Pulse 97   Temp 98.5 F (36.9 C) (Oral)   Resp 16   LMP 12/25/2015  ABD soft NT  No rebound  Lungs CTA CV RRR CX long and closed , no blood on glove  NST FHR 150 + accels , no decels , no ctx Labs: ua: + protein , otherwise negative  , CBC  A: 24 week with vaginal spotting , no blood seen on exam glove  Benign exam  Reassuring fetal monitoring for 24 weeks  P:pt is reassured  precautions given for additional bleeding to rtc / L+D  If ua grows out tx with abx.

## 2016-06-16 LAB — URINE CULTURE

## 2016-06-24 ENCOUNTER — Observation Stay
Admission: EM | Admit: 2016-06-24 | Discharge: 2016-06-24 | Disposition: A | Discharge status: Home / Self Care | Payer: Medicaid Other | Attending: Obstetrics & Gynecology | Admitting: Obstetrics & Gynecology

## 2016-06-24 DIAGNOSIS — O26892 Other specified pregnancy related conditions, second trimester: Principal | ICD-10-CM | POA: Insufficient documentation

## 2016-06-24 DIAGNOSIS — R109 Unspecified abdominal pain: Secondary | ICD-10-CM | POA: Diagnosis present

## 2016-06-24 DIAGNOSIS — Z79899 Other long term (current) drug therapy: Secondary | ICD-10-CM | POA: Diagnosis not present

## 2016-06-24 DIAGNOSIS — O99012 Anemia complicating pregnancy, second trimester: Secondary | ICD-10-CM | POA: Diagnosis not present

## 2016-06-24 DIAGNOSIS — Z3A26 26 weeks gestation of pregnancy: Secondary | ICD-10-CM | POA: Insufficient documentation

## 2016-06-24 LAB — COMPREHENSIVE METABOLIC PANEL
ALBUMIN: 3.1 g/dL — AB (ref 3.5–5.0)
ALT: 10 U/L — AB (ref 14–54)
AST: 18 U/L (ref 15–41)
Alkaline Phosphatase: 72 U/L (ref 38–126)
Anion gap: 5 (ref 5–15)
BILIRUBIN TOTAL: 0.2 mg/dL — AB (ref 0.3–1.2)
BUN: 7 mg/dL (ref 6–20)
CHLORIDE: 106 mmol/L (ref 101–111)
CO2: 24 mmol/L (ref 22–32)
CREATININE: 0.34 mg/dL — AB (ref 0.44–1.00)
Calcium: 8.4 mg/dL — ABNORMAL LOW (ref 8.9–10.3)
GFR calc Af Amer: >60 mL/min (ref >60)
GLUCOSE: 81 mg/dL (ref 65–99)
Potassium: 3.3 mmol/L — ABNORMAL LOW (ref 3.5–5.1)
Sodium: 135 mmol/L (ref 135–145)
Total Protein: 6.9 g/dL (ref 6.5–8.1)

## 2016-06-24 LAB — CBC WITH DIFFERENTIAL/PLATELET
BASOS PCT: 0 %
Basophils Absolute: 0 10*3/uL (ref 0–0.1)
Eosinophils Absolute: 0.1 10*3/uL (ref 0–0.7)
Eosinophils Relative: 1 %
HEMATOCRIT: 26 % — AB (ref 35.0–47.0)
Hemoglobin: 8.9 g/dL — ABNORMAL LOW (ref 12.0–16.0)
LYMPHS PCT: 21 %
Lymphs Abs: 1.6 10*3/uL (ref 1.0–3.6)
MCH: 24.7 pg — ABNORMAL LOW (ref 26.0–34.0)
MCHC: 34.3 g/dL (ref 32.0–36.0)
MCV: 72.2 fL — AB (ref 80.0–100.0)
Monocytes Absolute: 0.6 10*3/uL (ref 0.2–0.9)
Monocytes Relative: 8 %
NEUTROS ABS: 5.2 10*3/uL (ref 1.4–6.5)
NEUTROS PCT: 70 %
PLATELETS: 170 10*3/uL (ref 150–440)
RBC: 3.6 MIL/uL — AB (ref 3.80–5.20)
RDW: 15.5 % — ABNORMAL HIGH (ref 11.5–14.5)
WBC: 7.4 10*3/uL (ref 3.6–11.0)

## 2016-06-24 LAB — URINE DRUG SCREEN, QUALITATIVE (ARMC ONLY)
Amphetamines, Ur Screen: NOT DETECTED
BARBITURATES, UR SCREEN: NOT DETECTED
BENZODIAZEPINE, UR SCRN: NOT DETECTED
CANNABINOID 50 NG, UR ~~LOC~~: POSITIVE — AB
Cocaine Metabolite,Ur ~~LOC~~: NOT DETECTED
MDMA (Ecstasy)Ur Screen: NOT DETECTED
METHADONE SCREEN, URINE: NOT DETECTED
Opiate, Ur Screen: NOT DETECTED
Phencyclidine (PCP) Ur S: NOT DETECTED
TRICYCLIC, UR SCREEN: NOT DETECTED

## 2016-06-24 LAB — TYPE AND SCREEN
ABO/RH(D): A POS
ANTIBODY SCREEN: NEGATIVE

## 2016-06-24 LAB — KLEIHAUER-BETKE STAIN
FETAL CELLS %: 1.8 %
Quantitation Fetal Hemoglobin: 0.0018 mL

## 2016-06-24 LAB — ETHANOL

## 2016-06-24 MED ORDER — SODIUM CHLORIDE FLUSH 0.9 % IV SOLN
INTRAVENOUS | Status: AC
  Filled 2016-06-24: qty 20

## 2016-06-24 MED ORDER — LACTATED RINGERS IV BOLUS (SEPSIS): 500.0000 mL | Freq: Once | INTRAVENOUS | Status: DC

## 2016-06-24 MED ORDER — LACTATED RINGERS IV SOLN: INTRAVENOUS | Status: DC

## 2016-06-24 MED ORDER — LACTATED RINGERS IV SOLN: Freq: Once | INTRAVENOUS | Status: DC

## 2016-06-24 NOTE — Discharge Instructions (Signed)
Keep appointment at [facility] for iron trnasfusion on friday

## 2016-06-24 NOTE — Plan of Care (Signed)
Pt states she is not taking her iron medication. States she is scheduled to have an iron transfusion this Friday at [facility]

## 2016-06-24 NOTE — Discharge Summary (Signed)
Katherine Hensley is a 31 y.o. female. She is at 8326w0d gestation. Patient's last menstrual period was 12/25/2015. Estimated Date of Delivery: 09/30/16  Chief Complaint: abdominal pain  S:  no CTX, no VB.no LOF,  Active fetal movement.    Location: low abdomen/pelvis Onset/timing/duration: sudden onset, once, about an hour ago Quality: sharp, intense Severity: severe Aggravating factors: movement Alleviating factors: rest Associated signs/symptoms: had chills overnight, no fever, had normal constipated BM last night, no intercourse, trauma, assault,  Context: patient was working on [employer] at [employer] when she had sudden onset abdominal pain, doubling her over, sobbing in tears/pain, called EMS who delivered her here.  Maternal Medical History:   Past Medical History:  Diagnosis Date  . Anemia   . Asthma   . Enlarged thyroid   . Sickle cell anemia (HCC)     Past Surgical History:  Procedure Laterality Date  . CESAREAN SECTION      No Known Allergies  Prior to Admission medications   Medication Sig Start Date End Date Taking? Authorizing Provider  acetaminophen (TYLENOL) 325 MG tablet Take 650 mg by mouth every 6 (six) hours as needed.    Historical Provider, MD  ferrous sulfate (FERROUSUL) 325 (65 FE) MG tablet Take 325 mg by mouth daily.    Historical Provider, MD  fluticasone (FLONASE) 50 MCG/ACT nasal spray Place 2 sprays into both nostrils daily. Patient not taking: Reported on 06/15/2016 05/26/16   Charlesetta IvoryJenise V Bacon Menshew, PA-C  folic acid (FOLVITE) 1 MG tablet Take 2 tablets (2 mg total) by mouth daily before supper. Patient not taking: Reported on 06/15/2016 02/13/16   Shari ProwsJolanta B Pucilowska, MD  ondansetron (ZOFRAN ODT) 4 MG disintegrating tablet Take 1 tablet (4 mg total) by mouth every 6 (six) hours as needed for nausea or vomiting. 05/26/16   Charlesetta IvoryJenise V Bacon Menshew, PA-C  prenatal vitamin w/FE, FA (PRENATAL 1 + 1) 27-1 MG TABS tablet Take 1 tablet by mouth daily at 12 noon.  02/14/16   Shari ProwsJolanta B Pucilowska, MD  sertraline (ZOLOFT) 50 MG tablet Take 50 mg by mouth daily. 04/19/16   Historical Provider, MD     [facility]   Social History: She  reports that she quit smoking about 3 months ago. Her smoking use included Cigars. She has never used smokeless tobacco. She reports that she drinks alcohol. She reports that she uses drugs, including Marijuana.  Family History:  no history of gyn cancers  Review of Systems: A full review of systems was performed and negative except as noted in the HPI.     O:  Temp 98.1 F (36.7 C)   LMP 12/25/2015  Results for orders placed or performed during the hospital encounter of 06/24/16 (from the past 48 hour(s))  CBC with Differential/Platelet   Collection Time: 06/24/16  3:10 PM  Result Value Ref Range   WBC 7.4 3.6 - 11.0 K/uL   RBC 3.60 (L) 3.80 - 5.20 MIL/uL   Hemoglobin 8.9 (L) 12.0 - 16.0 g/dL   HCT 16.126.0 (L) 09.635.0 - 04.547.0 %   MCV 72.2 (L) 80.0 - 100.0 fL   MCH 24.7 (L) 26.0 - 34.0 pg   MCHC 34.3 32.0 - 36.0 g/dL   RDW 40.915.5 (H) 81.111.5 - 91.414.5 %   Platelets 170 150 - 440 K/uL   Neutrophils Relative % 70 %   Neutro Abs 5.2 1.4 - 6.5 K/uL   Lymphocytes Relative 21 %   Lymphs  Abs 1.6 1.0 - 3.6 K/uL   Monocytes Relative 8 %   Monocytes Absolute 0.6 0.2 - 0.9 K/uL   Eosinophils Relative 1 %   Eosinophils Absolute 0.1 0 - 0.7 K/uL   Basophils Relative 0 %   Basophils Absolute 0.0 0 - 0.1 K/uL  Comprehensive metabolic panel   Collection Time: 06/24/16  3:10 PM  Result Value Ref Range   Sodium 135 135 - 145 mmol/L   Potassium 3.3 (L) 3.5 - 5.1 mmol/L   Chloride 106 101 - 111 mmol/L   CO2 24 22 - 32 mmol/L   Glucose, Bld 81 65 - 99 mg/dL   BUN 7 6 - 20 mg/dL   Creatinine, Ser 1.61 (L) 0.44 - 1.00 mg/dL   Calcium 8.4 (L) 8.9 - 10.3 mg/dL   Total Protein 6.9 6.5 - 8.1 g/dL   Albumin 3.1 (L) 3.5 - 5.0 g/dL   AST 18 15 - 41 U/L   ALT 10 (L) 14 - 54 U/L   Alkaline Phosphatase 72  38 - 126 U/L   Total Bilirubin 0.2 (L) 0.3 - 1.2 mg/dL   GFR calc non Af Amer >60 >60 mL/min   GFR calc Af Amer >60 >60 mL/min   Anion gap 5 5 - 15  Type and screen [facility]   Collection Time: 06/24/16  3:10 PM  Result Value Ref Range   ABO/RH(D) A POS    Antibody Screen NEG    Sample Expiration 06/27/2016   Ethanol   Collection Time: 06/24/16  3:22 PM  Result Value Ref Range   Alcohol, Ethyl (B) <5 <5 mg/dL  Urine Drug Screen, Qualitative ([facility] only)   Collection Time: 06/24/16  3:47 PM  Result Value Ref Range   Tricyclic, Ur Screen NONE DETECTED NONE DETECTED   Amphetamines, Ur Screen NONE DETECTED NONE DETECTED   MDMA (Ecstasy)Ur Screen NONE DETECTED NONE DETECTED   Cocaine Metabolite,Ur [place] NONE DETECTED NONE DETECTED   Opiate, Ur Screen NONE DETECTED NONE DETECTED   Phencyclidine (PCP) Ur S NONE DETECTED NONE DETECTED   Cannabinoid 50 Ng, Ur [place] POSITIVE (A) NONE DETECTED   Barbiturates, Ur Screen NONE DETECTED NONE DETECTED   Benzodiazepine, Ur Scrn NONE DETECTED NONE DETECTED   Methadone Scn, Ur NONE DETECTED NONE DETECTED     Constitutional: NAD, AAOx3  HE/ENT: extraocular movements grossly intact, moist mucous membranes CV: RRR PULM: nl respiratory effort, CTABL     Abd: gravid, non-distended, soft, pinpoint pain with light palpation in mid RLQ no other areas of pain.  No ecchymosis or erythema.       Ext: Non-tender, Nonedmeatous   Psych: mood appropriate, speech normal Pelvic: deferred  FHT: 150 mod TOCO: quiet  Ultrasound:  Cephalic Fundal placenta - no evidence of separation of pooling of blood, intact membranes, no area of thinning of myometrium.  A small area of myometrium is thickened at site of pain on the R side, no blood flow within, not well circumscribed (not a fibroid).  Normal appearing LUS.  ++ stool in RLQ, no tubular body.    A/P:  30yo [id number] @ 26.0 with sudden onset abdominal pain.   Labor: not present.   Fetal  Wellbeing: Reassuring Cat 1 tracing.  Pain:  Resolved after sleeping.  Non-tender to palpation.  Possibly gas/distention with constipation in cecum.  No leukocytosis.  Anemia: has iron infusion scheduled this Friday.  D/c home stable, precautions reviewed, follow-up as scheduled.   ----- Ranae Plumber, MD Attending  Obstetrician and [employer], Department of OB/GYN [facility]

## 2016-06-24 NOTE — Plan of Care (Signed)
Iv fluids 1000cc LR administered through iv  Saline locked in left antecubital site started by EMS

## 2016-06-24 NOTE — Progress Notes (Signed)
Dr ward checked on pt. States pt is sleeping.

## 2016-06-24 NOTE — Plan of Care (Signed)
Pt presents to l/d via EMS stretcher with c/o lower abdominal pain that feels like it is tearing. Pt has hx of 3 prior csections. Abdomen soft. Dr ward notified of pt's behavior on arrival. States she will be here shortly

## 2016-06-24 NOTE — Progress Notes (Signed)
Pt admits to using marijuana one week ago but none today. Denies using any other drugs

## 2016-06-25 LAB — RPR: RPR Ser Ql: NONREACTIVE

## 2016-07-26 ENCOUNTER — Observation Stay
Admission: EM | Admit: 2016-07-26 | Discharge: 2016-07-26 | Disposition: A | Discharge status: Home / Self Care | Payer: Medicaid Other | Attending: Obstetrics and Gynecology | Admitting: Obstetrics and Gynecology

## 2016-07-26 DIAGNOSIS — F329 Major depressive disorder, single episode, unspecified: Secondary | ICD-10-CM | POA: Insufficient documentation

## 2016-07-26 DIAGNOSIS — Z3A30 30 weeks gestation of pregnancy: Secondary | ICD-10-CM | POA: Insufficient documentation

## 2016-07-26 DIAGNOSIS — Z87891 Personal history of nicotine dependence: Secondary | ICD-10-CM | POA: Diagnosis not present

## 2016-07-26 DIAGNOSIS — O368130 Decreased fetal movements, third trimester, not applicable or unspecified: Secondary | ICD-10-CM | POA: Diagnosis not present

## 2016-07-26 DIAGNOSIS — D571 Sickle-cell disease without crisis: Secondary | ICD-10-CM | POA: Diagnosis not present

## 2016-07-26 DIAGNOSIS — O99343 Other mental disorders complicating pregnancy, third trimester: Secondary | ICD-10-CM | POA: Diagnosis not present

## 2016-07-26 DIAGNOSIS — Z79899 Other long term (current) drug therapy: Secondary | ICD-10-CM | POA: Diagnosis not present

## 2016-07-26 DIAGNOSIS — O99013 Anemia complicating pregnancy, third trimester: Secondary | ICD-10-CM | POA: Diagnosis not present

## 2016-07-26 DIAGNOSIS — O368190 Decreased fetal movements, unspecified trimester, not applicable or unspecified: Secondary | ICD-10-CM | POA: Diagnosis present

## 2016-07-26 HISTORY — DX: Major depressive disorder, single episode, unspecified: F32.9

## 2016-07-26 HISTORY — DX: Depression, unspecified: F32.A

## 2016-07-26 NOTE — OB Triage Note (Signed)
Patient presented to L&D stating she hasn't felt her baby move since last night.

## 2016-07-26 NOTE — Discharge Summary (Signed)
Suzy Bouchard, MD  Obstetrics  Expand All Collapse All   Hide copied text Hover for attribution information Patient ID: Katherine Hensley, female   DOB: March 12, 1986, 31 y.o.   MRN: [id number]  Katherine Hensley is a 31 y.o. female. She is at [redacted]w[redacted]d gestation. Patient's last menstrual period was 12/25/2015. Estimated Date of Delivery: 09/30/16  Prenatal care site: [facility] Chief Complaint:decreased fetal movt since 07/24/16  No LOF , no vag bleeding   She has not eaten today  Since here she has felt the baby move more    Maternal Medical History:       Past Medical History:  Diagnosis Date  . Anemia   . Asthma   . Depression   . Enlarged thyroid   . Sickle cell anemia (HCC)          Past Surgical History:  Procedure Laterality Date  . CESAREAN SECTION      No Known Allergies         Prior to Admission medications   Medication Sig Start Date End Date Taking? Authorizing Provider  acetaminophen (TYLENOL) 325 MG tablet Take 650 mg by mouth every 6 (six) hours as needed.   Yes Historical Provider, MD  ondansetron (ZOFRAN ODT) 4 MG disintegrating tablet Take 1 tablet (4 mg total) by mouth every 6 (six) hours as needed for nausea or vomiting. 05/26/16  Yes Jenise V Bacon Menshew, PA-C  prenatal vitamin w/FE, FA (PRENATAL 1 + 1) 27-1 MG TABS tablet Take 1 tablet by mouth daily at 12 noon. 02/14/16  Yes Jolanta B Pucilowska, MD  sertraline (ZOLOFT) 50 MG tablet Take 50 mg by mouth daily. 04/19/16  Yes Historical Provider, MD  ferrous sulfate (FERROUSUL) 325 (65 FE) MG tablet Take 325 mg by mouth daily.    Historical Provider, MD  fluticasone (FLONASE) 50 MCG/ACT nasal spray Place 2 sprays into both nostrils daily. Patient not taking: Reported on 06/15/2016 05/26/16   Charlesetta Ivory Menshew, PA-C  folic acid (FOLVITE) 1 MG tablet Take 2 tablets (2 mg total) by mouth daily before supper. Patient not taking: Reported on 06/15/2016 02/13/16   Shari Prows, MD       Social History: She  reports that she quit smoking about 4 months ago. Her smoking use included Cigars. She has never used smokeless tobacco. She reports that she does not drink alcohol or use drugs.  Family History: No Gyn cancer, + FHX of cerebral palsy - brother   Review of Systems: A full review of systems was performed and negative except as noted in the HPI.   Hematologic : h/o sickle cell trait , anemia  Endocrine : enlarged thyroid - nl TFT  Gyn : + STD     O:  BP 104/60 (BP Location: Left Arm)   Pulse 70   Temp 97.6 F (36.4 C) (Oral)   Resp 17   Ht  (1.702 m)   Wt 81.6 kg (180 lb)   LMP 12/25/2015   BMI 28.19 kg/m  No results found for this or any previous visit (from the past 48 hour(s)).   Constitutional: NAD, AAOx3  HE/ENT: extraocular movements grossly intact, moist mucous membranes CV: RRR PULM: nl respiratory effort, CTABL                                         Abd: gravid, non-tender, non-distended, soft  Ext: Non-tender, Nonedmeatous                     Psych: mood appropriate, speech normal Pelvic:no checked   U/S by TJS : AFI =14 cm  FHT: 10x10 x 2 . Some small variable decels  TOCO: quiet  Imaging Results  No results found.    A/P:  Decreased fetal movt , with reasurring AFI and NST . + fetal movt now   Labor: not present.   Fetal Wellbeing: Reassuring Cat 1 tracing.  D/c home stable, precautions reviewed, follow-up as scheduled.   ----- Jennell Corner MD  Attending Obstetrician and Gynecologist [facility], Department of OB/GYN [facility]     Electronically signed by Suzy Bouchard, MD at 07/26/2016 10:43 AM      ED to Hosp-Admission (Current) on 07/26/2016        Detailed Report

## 2016-07-26 NOTE — Progress Notes (Signed)
Patient ID: Katherine Hensley, female   DOB: [birth year], 31 y.o.   MRN: [id number]  Katherine Hensley is a 32 y.o. female. She is at [redacted]w[redacted]d gestation. Patient's last menstrual period was 12/25/2015. Estimated Date of Delivery: 09/30/16  Prenatal care site: [facility] Chief Complaint:decreased fetal movt since 07/24/16  No LOF , no vag bleeding   She has not eaten today  Since here she has felt the baby move more    Maternal Medical History:   Past Medical History:  Diagnosis Date  . Anemia   . Asthma   . Depression   . Enlarged thyroid   . Sickle cell anemia (HCC)     Past Surgical History:  Procedure Laterality Date  . CESAREAN SECTION      No Known Allergies  Prior to Admission medications   Medication Sig Start Date End Date Taking? Authorizing Provider  acetaminophen (TYLENOL) 325 MG tablet Take 650 mg by mouth every 6 (six) hours as needed.   Yes Historical Provider, MD  ondansetron (ZOFRAN ODT) 4 MG disintegrating tablet Take 1 tablet (4 mg total) by mouth every 6 (six) hours as needed for nausea or vomiting. 05/26/16  Yes Jenise V Bacon Menshew, PA-C  prenatal vitamin w/FE, FA (PRENATAL 1 + 1) 27-1 MG TABS tablet Take 1 tablet by mouth daily at 12 noon. 02/14/16  Yes Jolanta B Pucilowska, MD  sertraline (ZOLOFT) 50 MG tablet Take 50 mg by mouth daily. 04/19/16  Yes Historical Provider, MD  ferrous sulfate (FERROUSUL) 325 (65 FE) MG tablet Take 325 mg by mouth daily.    Historical Provider, MD  fluticasone (FLONASE) 50 MCG/ACT nasal spray Place 2 sprays into both nostrils daily. Patient not taking: Reported on 06/15/2016 05/26/16   Charlesetta Ivory Menshew, PA-C  folic acid (FOLVITE) 1 MG tablet Take 2 tablets (2 mg total) by mouth daily before supper. Patient not taking: Reported on 06/15/2016 02/13/16   Shari Prows, MD     Social History: She  reports that she quit smoking about 4 months ago. Her smoking use included Cigars. She has never used smokeless tobacco. She reports that she does  not drink alcohol or use drugs.  Family History: No Gyn cancer, + FHX of cerebral palsy - brother   Review of Systems: A full review of systems was performed and negative except as noted in the HPI.   Hematologic : h/o sickle cell trait , anemia  Endocrine : enlarged thyroid - nl TFT  Gyn : + STD     O:  BP 104/60 (BP Location: Left Arm)   Pulse 70   Temp 97.6 F (36.4 C) (Oral)   Resp 17   Ht  (1.702 m)   Wt 81.6 kg (180 lb)   LMP 12/25/2015   BMI 28.19 kg/m  No results found for this or any previous visit (from the past 48 hour(s)).   Constitutional: NAD, AAOx3  HE/ENT: extraocular movements grossly intact, moist mucous membranes CV: RRR PULM: nl respiratory effort, CTABL     Abd: gravid, non-tender, non-distended, soft      Ext: Non-tender, Nonedmeatous   Psych: mood appropriate, speech normal Pelvic:no checked   U/S by TJS : AFI =14 cm  FHT: 10x10 x 2 . Some small variable decels  TOCO: quiet  No results found.  A/P:  Decreased fetal movt , with reasurring AFI and NST . + fetal movt now   Labor: not present.   Fetal Wellbeing: Reassuring Cat 1 tracing.  D/c home stable, precautions reviewed, follow-up as scheduled.   ----- Jennell Corner MD  Attending Obstetrician and Gynecologist [facility], Department of OB/GYN [facility]

## 2017-02-17 ENCOUNTER — Encounter (HOSPITAL_COMMUNITY): Payer: Self-pay
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
## 2017-03-16 IMAGING — US US OB LIMITED
1 series · 14 of 23 positions shown · non-contrast
Comparison: none

CLINICAL DATA: Pregnancy.  Fall .

EXAM:
LIMITED OBSTETRIC ULTRASOUND AND TRANSVAGINAL OBSTETRIC ULTRASOUND

[Series 1: us ob limited · 0.25mm/px · 14 of 23 slices shown]
[im 1/23]
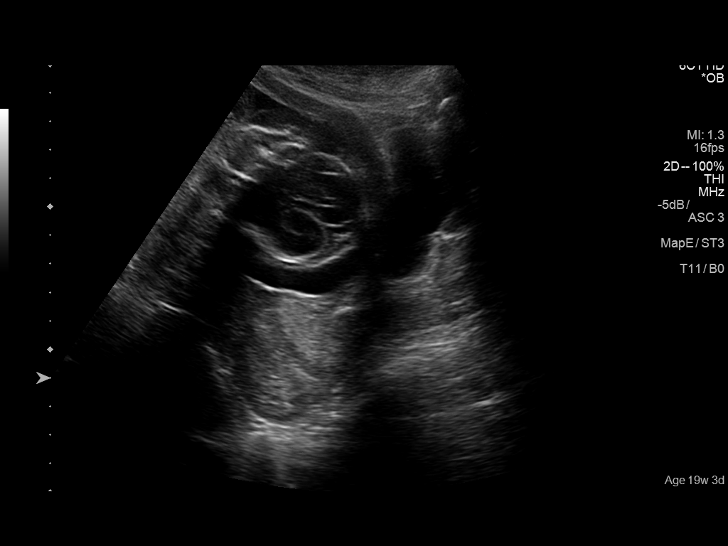
[im 3/23]
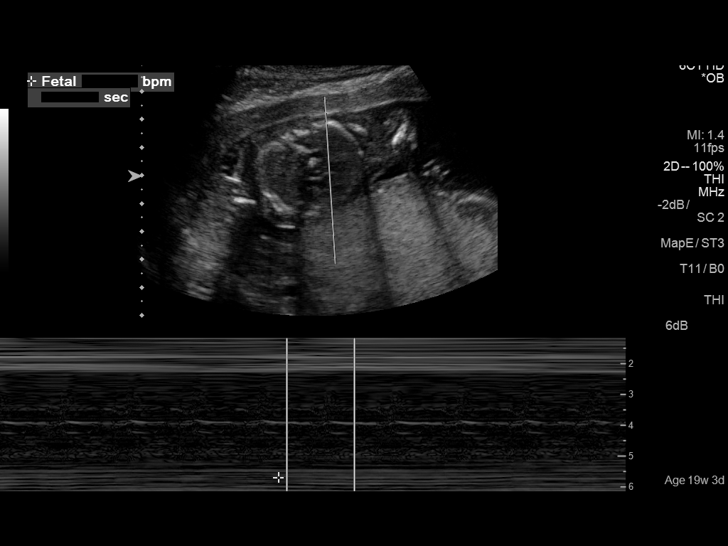
[im 5/23]
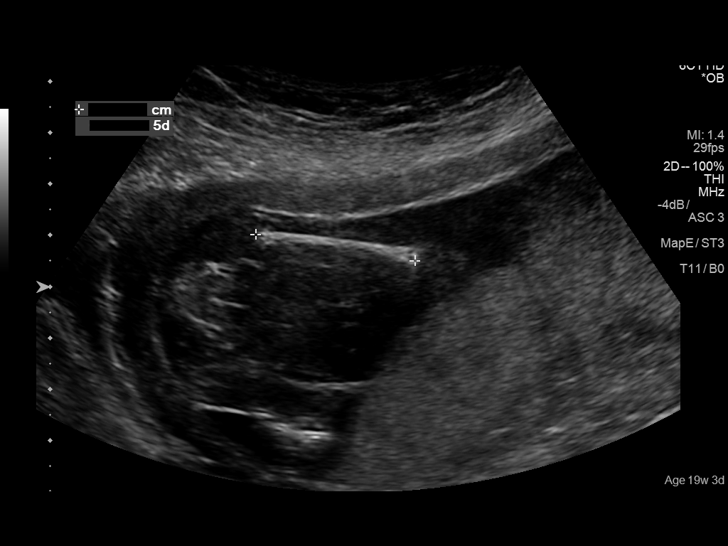
[im 6/23]
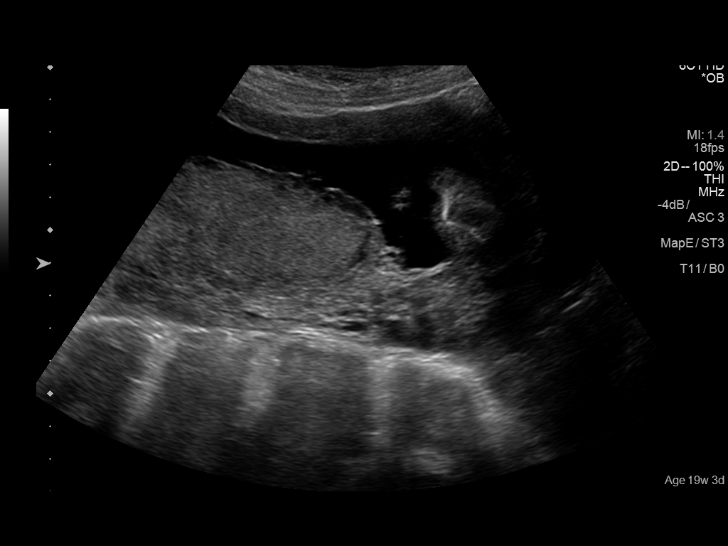
[im 8/23]
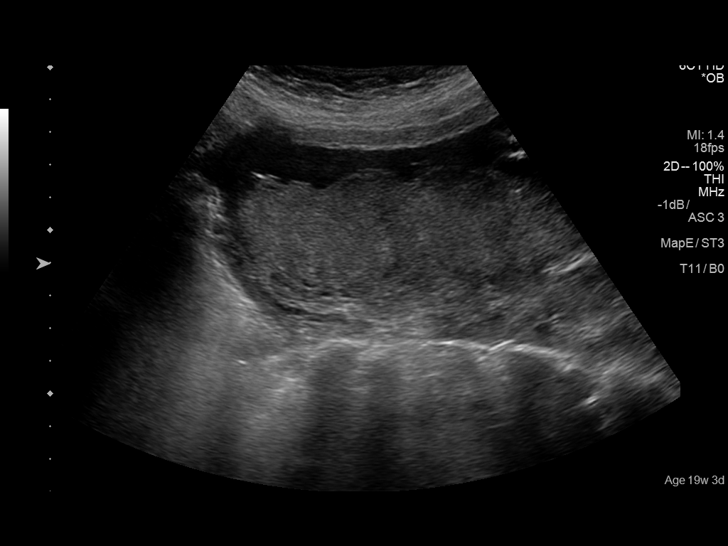
[im 10/23]
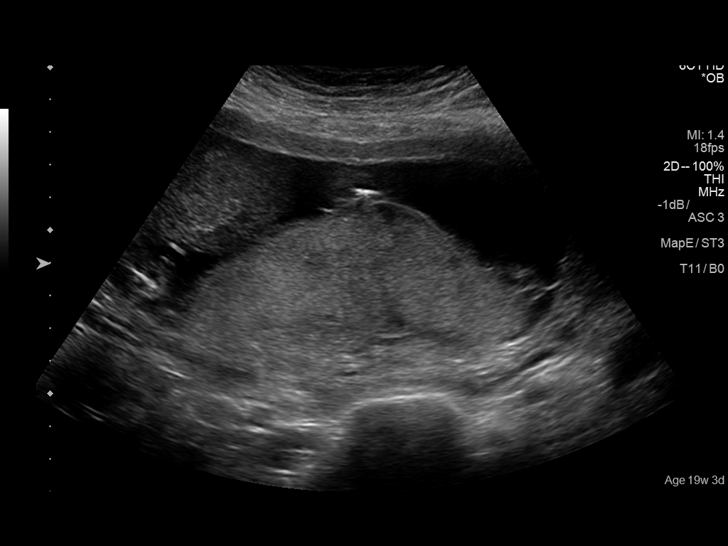
[im 11/23]
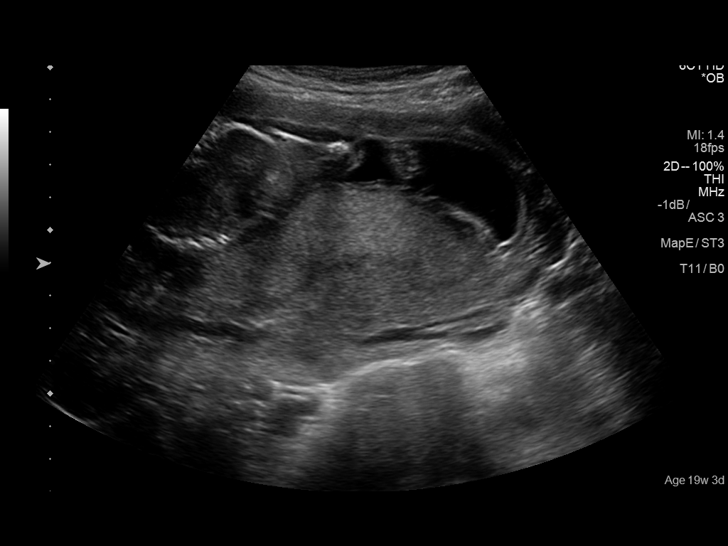
[im 13/23]
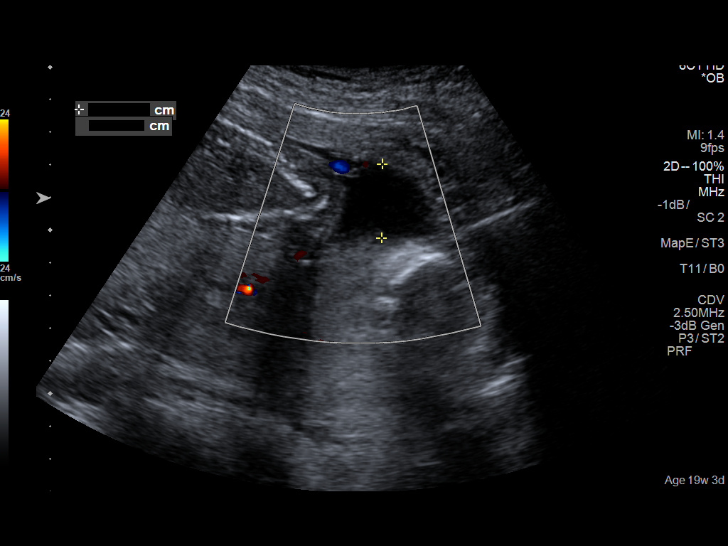
[im 14/23]
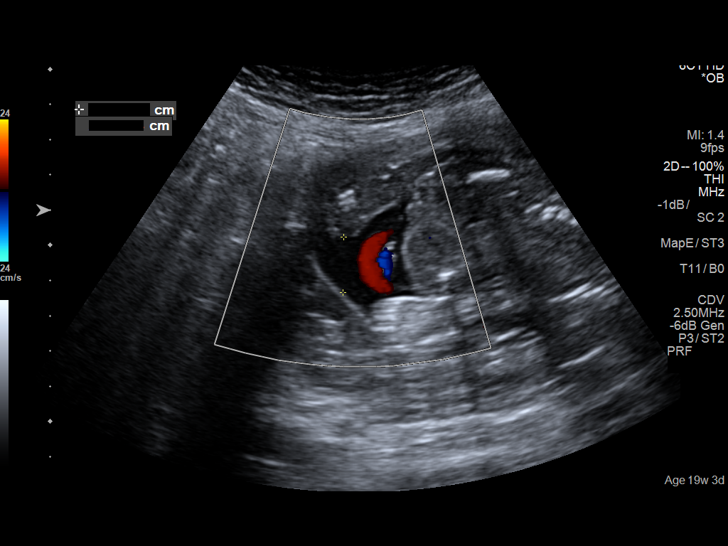
[im 16/23]
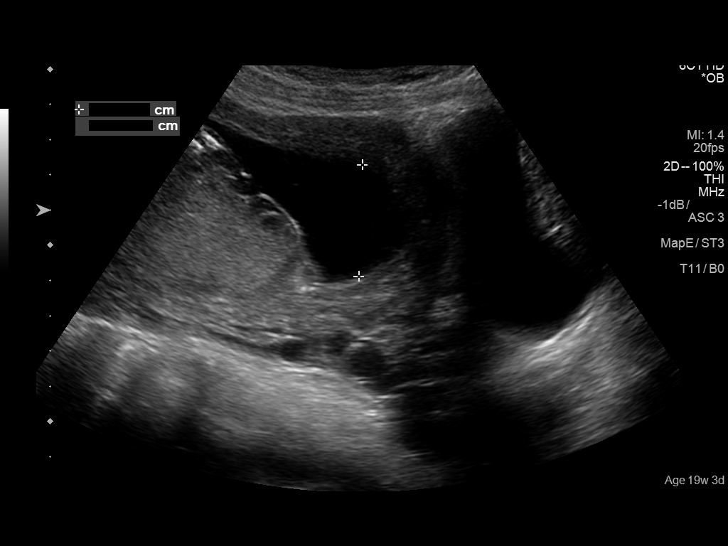
[im 18/23]
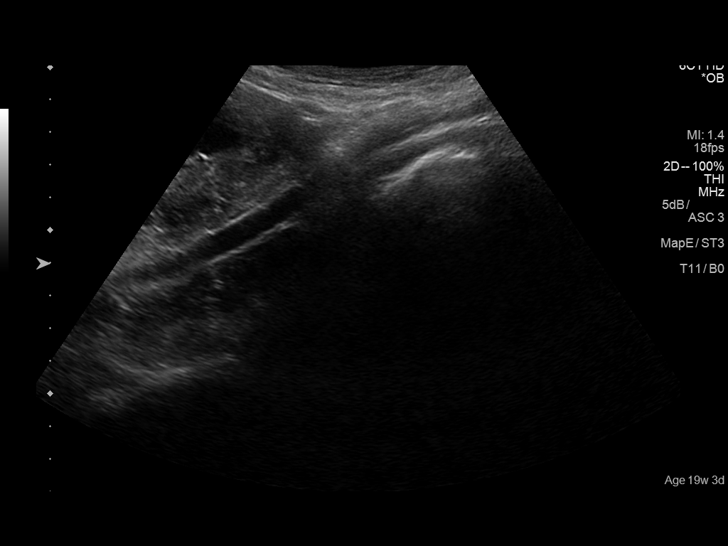
[im 19/23]
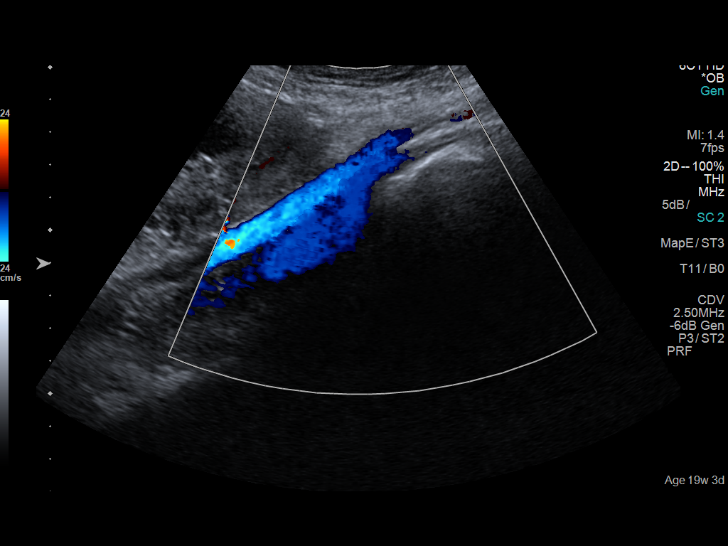
[im 21/23]
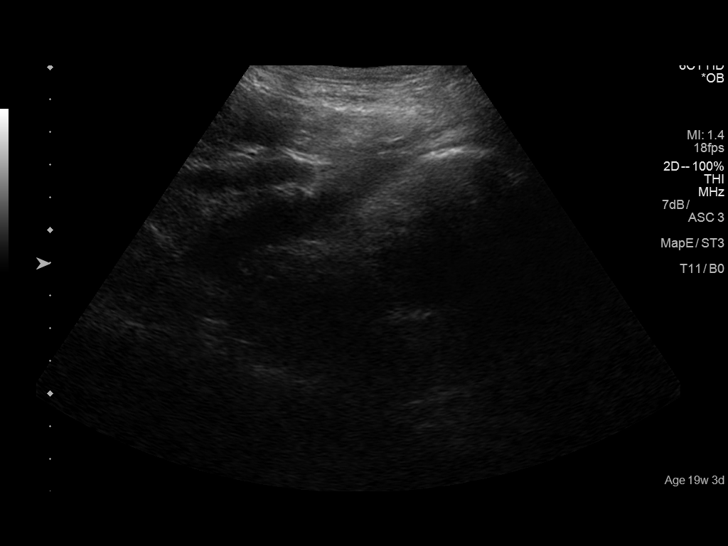
[im 23/23]
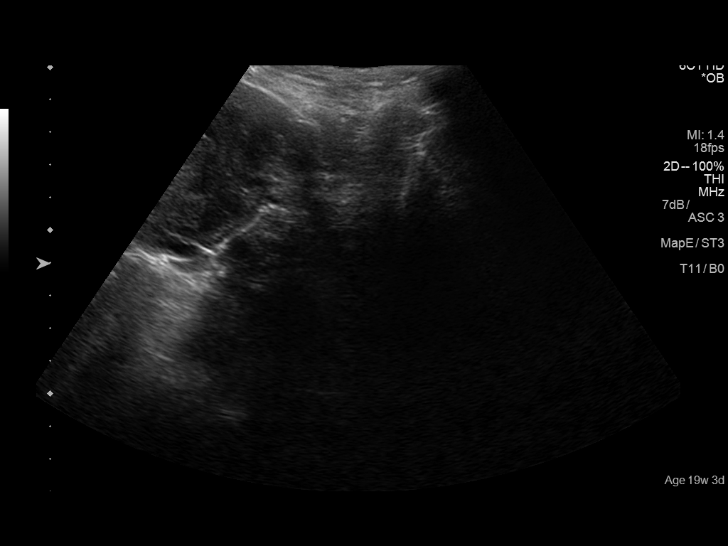

[14 of 23 positions shown; findings below may reference images not displayed]

FINDINGS: Number of Fetuses: 1

Heart Rate:  152 bpm

Movement: Present

Presentation: Cephalic

Placental Location: Posterior

Previa: No

Amniotic Fluid (Subjective): Amniotic fluid volume appears
subjectively low. AFI 10.9 cm.

BPD:  3.1cm 19w  5d

MATERNAL FINDINGS:

Cervix:  Appears closed.

Uterus/Adnexae:  No abnormality visualized.
IMPRESSION: Single viable intrauterine pregnancy at 19 weeks 5 days. Amniotic
fluid volume appears subjectively low. AFI 10.9 cm.

This exam is performed on an emergent basis and does not
comprehensively evaluate fetal size, dating, or anatomy; follow-up
complete OB US should be considered if further fetal assessment is
warranted.

## 2017-07-12 ENCOUNTER — Other Ambulatory Visit: Payer: Self-pay

## 2017-07-12 ENCOUNTER — Emergency Department
Admission: EM | Admit: 2017-07-12 | Discharge: 2017-07-12 | Disposition: A | Discharge status: Home / Self Care | Payer: Self-pay | Attending: Emergency Medicine | Admitting: Emergency Medicine

## 2017-07-12 ENCOUNTER — Encounter: Payer: Self-pay | Admitting: Emergency Medicine

## 2017-07-12 DIAGNOSIS — J020 Streptococcal pharyngitis: Secondary | ICD-10-CM | POA: Insufficient documentation

## 2017-07-12 DIAGNOSIS — J45909 Unspecified asthma, uncomplicated: Secondary | ICD-10-CM | POA: Insufficient documentation

## 2017-07-12 DIAGNOSIS — Z79899 Other long term (current) drug therapy: Secondary | ICD-10-CM | POA: Insufficient documentation

## 2017-07-12 DIAGNOSIS — R509 Fever, unspecified: Secondary | ICD-10-CM

## 2017-07-12 DIAGNOSIS — Z87891 Personal history of nicotine dependence: Secondary | ICD-10-CM | POA: Insufficient documentation

## 2017-07-12 LAB — INFLUENZA PANEL BY PCR (TYPE A & B)
Influenza A By PCR: NEGATIVE
Influenza B By PCR: NEGATIVE

## 2017-07-12 LAB — GROUP A STREP BY PCR: GROUP A STREP BY PCR: DETECTED — AB

## 2017-07-12 MED ORDER — AMOXICILLIN 875 MG PO TABS: 875.0000 mg | ORAL_TABLET | Freq: Two times a day (BID) | ORAL | 0 refills | Status: DC

## 2017-07-12 MED ORDER — AMOXICILLIN 500 MG PO CAPS
1000.0000 mg | ORAL_CAPSULE | Freq: Once | ORAL | Status: AC
  Administered 2017-07-12: 1000 mg via ORAL
  Filled 2017-07-12: qty 2

## 2017-07-12 MED ORDER — IBUPROFEN 800 MG PO TABS
800.0000 mg | ORAL_TABLET | Freq: Once | ORAL | Status: AC
  Administered 2017-07-12: 800 mg via ORAL
  Filled 2017-07-12: qty 1

## 2017-07-12 MED ORDER — IBUPROFEN 800 MG PO TABS: 800.0000 mg | ORAL_TABLET | Freq: Three times a day (TID) | ORAL | 0 refills | Status: DC | PRN

## 2017-07-12 NOTE — Discharge Instructions (Addendum)
Please take antibiotics as prescribed.  Please drink lots of fluids.  Alternate Tylenol and ibuprofen as needed for fevers and body aches.  Please make sure you take and complete antibiotic regimen.  Return to the ER for any worsening fevers, difficulty swallowing, or for any urgent changes in health.

## 2017-07-12 NOTE — ED Notes (Signed)
Pt reports started yesterday with fever and chills then developed a dry cough.

## 2017-07-12 NOTE — ED Provider Notes (Signed)
[facility] EMERGENCY DEPARTMENT Provider Note   CSN: [id number] Arrival date & time: 07/12/17  2026     History   Chief Complaint Chief Complaint  Patient presents with  . Cough  . Generalized Body Aches  . Sore Throat    HPI Katherine Hensley is a 32 y.o. female presents to the emergency department for evaluation of cough, body aches, headache, sore throat, congestion.  Symptoms are present for 1 day.  She has had a fever up to 101.  She took Tylenol earlier today but no ibuprofen.  She is tolerating p.o. well.  Sore throat pain is mild.  No difficulty swallowing.  She denies any rashes, abdominal pain, nausea vomiting or diarrhea.  No known contacts with strep or flu.  HPI  Past Medical History:  Diagnosis Date  . Anemia   . Asthma   . Depression   . Enlarged thyroid   . Sickle cell anemia [facility])     Patient Active Problem List   Diagnosis Date Noted  . Decreased fetal movement 07/26/2016  . Abdominal pain 06/24/2016  . Vaginal spotting 06/15/2016  . 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 Cannabis use disorder, moderate, dependence (HCC) 02/12/2016  . Opioid use disorder, mild, abuse (HCC) 02/12/2016  . Pregnant and not yet delivered 02/12/2016  . Suicide attempt (HCC) 02/12/2016  . Tobacco use disorder 02/12/2016    Past Surgical History:  Procedure Laterality Date  . CESAREAN SECTION       OB History    Gravida  5   Para  3   Term  2   Preterm  1   AB  1   Living  3     SAB  1   TAB      Ectopic      Multiple      Live Births               Home Medications    Prior to Admission medications   Medication Sig Start Date End Date Taking? Authorizing Provider  acetaminophen (TYLENOL) 325 MG tablet Take 650 mg by mouth every 6 (six) hours as needed.    [provider]  amoxicillin (AMOXIL) 875 MG tablet Take 1 tablet (875 mg total) by mouth 2 (two) times daily. X 10  days 07/12/17   Evon Slack, PA-C  ferrous sulfate (FERROUSUL) 325 (65 FE) MG tablet Take 325 mg by mouth daily.    [provider]  fluticasone (FLONASE) 50 MCG/ACT nasal spray Place 2 sprays into both nostrils daily. Patient not taking: Reported on 06/15/2016 05/26/16   Menshew, Charlesetta Ivory, PA-C  folic acid (FOLVITE) 1 MG tablet Take 2 tablets (2 mg total) by mouth daily before supper. Patient not taking: Reported on 06/15/2016 02/13/16   Pucilowska, Braulio Conte B, MD  ibuprofen (ADVIL,MOTRIN) 800 MG tablet Take 1 tablet (800 mg total) by mouth every 8 (eight) hours as needed. 07/12/17   Evon Slack, PA-C  ondansetron (ZOFRAN ODT) 4 MG disintegrating tablet Take 1 tablet (4 mg total) by mouth every 6 (six) hours as needed for nausea or vomiting. 05/26/16   Menshew, Charlesetta Ivory, PA-C  prenatal vitamin w/FE, FA (PRENATAL 1 + 1) 27-1 MG TABS tablet Take 1 tablet by mouth daily at 12 noon. 02/14/16   Pucilowska, Braulio Conte B, MD  sertraline (ZOLOFT) 50 MG tablet Take 50 mg by mouth daily. 04/19/16   [provider]  Family History History reviewed. No pertinent family history.  Social History Social History   Tobacco Use  . Smoking status: Former Smoker    Types: Cigars    Last attempt to quit: 03/15/2016    Years since quitting: 1.3  . Smokeless tobacco: Never Used  Substance Use Topics  . Alcohol use: No    Comment: denies  . Drug use: No    Comment: denies. hx of marijuana use     Allergies   Patient has no known allergies.   Review of Systems Review of Systems  Constitutional: Negative for fever.  HENT: Positive for congestion, rhinorrhea and sore throat. Negative for ear discharge, sinus pressure, sinus pain, trouble swallowing and voice change.   Respiratory: Positive for cough. Negative for shortness of breath, wheezing and stridor.   Cardiovascular: Negative for chest pain.  Gastrointestinal: Negative for abdominal pain, diarrhea, nausea and vomiting.   Genitourinary: Negative for dysuria, flank pain and pelvic pain.  Musculoskeletal: Positive for myalgias. Negative for back pain.  Skin: Negative for rash.  Neurological: Negative for dizziness and headaches.     Physical Exam Updated Vital Signs BP 129/65 (BP Location: Left Arm)   Pulse (!) 101   Temp (!) 100.4 F (38 C) (Oral)   Resp 18   Ht 5\' 6"  (1.676 m)   Wt 80.7 kg (178 lb)   LMP  (LMP Unknown) Comment: pt denies intercourse with man since last menstrual cycle  SpO2 99%   BMI 28.73 kg/m   Physical Exam  Constitutional: She is oriented to person, place, and time. She appears well-developed and well-nourished. No distress.  HENT:  Head: Normocephalic and atraumatic.  Right Ear: Hearing, tympanic membrane, external ear and ear canal normal.  Left Ear: Hearing, tympanic membrane, external ear and ear canal normal.  Nose: Rhinorrhea present.  Mouth/Throat: Mucous membranes are normal. No trismus in the jaw. No uvula swelling. Posterior oropharyngeal erythema present. No oropharyngeal exudate, posterior oropharyngeal edema or tonsillar abscesses. Tonsillar exudate.  Uvula is midline with no sign of peritonsillar abscess.  Eyes: Conjunctivae are normal. Right eye exhibits no discharge. Left eye exhibits no discharge.  Neck: Normal range of motion.  Cardiovascular: Normal rate and regular rhythm.  Pulmonary/Chest: Effort normal and breath sounds normal. No stridor. No respiratory distress. She has no wheezes. She has no rales.  Abdominal: Soft. She exhibits no distension. There is no tenderness.  Musculoskeletal: Normal range of motion. She exhibits no deformity.  Lymphadenopathy:    She has cervical adenopathy.  Neurological: She is alert and oriented to person, place, and time. She has normal reflexes.  Skin: Skin is warm and dry.  Psychiatric: She has a normal mood and affect. Her behavior is normal. Thought content normal.     ED Treatments / Results  Labs (all labs  ordered are listed, but only abnormal results are displayed) Labs Reviewed  GROUP A STREP BY PCR - Abnormal; Notable for the following components:      Result Value   Group A Strep by PCR DETECTED (*)    All other components within normal limits  INFLUENZA PANEL BY PCR (TYPE A & B)    EKG None  Radiology No results found.  Procedures Procedures (including critical care time)  Medications Ordered in ED Medications  amoxicillin (AMOXIL) capsule 1,000 mg (has no administration in time range)  ibuprofen (ADVIL,MOTRIN) tablet 800 mg (800 mg Oral Given 07/12/17 2258)     Initial Impression / Assessment and Plan /  ED Course  I have reviewed the triage vital signs and the nursing notes.  Pertinent labs & imaging results that were available during my care of the patient were reviewed by me and considered in my medical decision making (see chart for details).     32 year old female with cough sore throat, congestion, body aches times 1 day.  She is also had fevers.  History and physical exam concerning for influenza along with strep pharyngitis due to exudative tonsillitis.  Ordered PCR strep and influenza, this test reviewed by me and indicate positive for strep pharyngitis.  Patient started on amoxicillin.  She is given ibuprofen.  She is educated on signs symptoms return to the ED for.  Final Clinical Impressions(s) / ED Diagnoses   Final diagnoses:  Strep pharyngitis  Fever, unspecified fever cause    ED Discharge Orders        Ordered    amoxicillin (AMOXIL) 875 MG tablet  2 times daily     07/12/17 2319    ibuprofen (ADVIL,MOTRIN) 800 MG tablet  Every 8 hours PRN     07/12/17 2319       Evon Slack, PA-C 07/12/17 2329    Don Perking, [place], MD 07/13/17 [phone]

## 2017-07-12 NOTE — ED Triage Notes (Signed)
Mom says since yesterday she's had generalized body aches, nonproductive cough and sore throat; is concerned she has the flu; son was also sick yesterday but feeling better today;

## 2017-09-06 ENCOUNTER — Emergency Department (HOSPITAL_COMMUNITY)
Admission: EM | Admit: 2017-09-06 | Discharge: 2017-09-06 | Disposition: A | Discharge status: Home / Self Care | Payer: Self-pay | Attending: Emergency Medicine | Admitting: Emergency Medicine

## 2017-09-06 ENCOUNTER — Other Ambulatory Visit: Payer: Self-pay

## 2017-09-06 ENCOUNTER — Encounter (HOSPITAL_COMMUNITY): Payer: Self-pay | Admitting: Emergency Medicine

## 2017-09-06 DIAGNOSIS — Z79899 Other long term (current) drug therapy: Secondary | ICD-10-CM | POA: Insufficient documentation

## 2017-09-06 DIAGNOSIS — Y9389 Activity, other specified: Secondary | ICD-10-CM | POA: Insufficient documentation

## 2017-09-06 DIAGNOSIS — S3141XA Laceration without foreign body of vagina and vulva, initial encounter: Secondary | ICD-10-CM | POA: Insufficient documentation

## 2017-09-06 DIAGNOSIS — Y9289 Other specified places as the place of occurrence of the external cause: Secondary | ICD-10-CM | POA: Insufficient documentation

## 2017-09-06 DIAGNOSIS — J45909 Unspecified asthma, uncomplicated: Secondary | ICD-10-CM | POA: Insufficient documentation

## 2017-09-06 DIAGNOSIS — Y999 Unspecified external cause status: Secondary | ICD-10-CM | POA: Insufficient documentation

## 2017-09-06 DIAGNOSIS — X58XXXA Exposure to other specified factors, initial encounter: Secondary | ICD-10-CM | POA: Insufficient documentation

## 2017-09-06 DIAGNOSIS — F17290 Nicotine dependence, other tobacco product, uncomplicated: Secondary | ICD-10-CM | POA: Insufficient documentation

## 2017-09-06 LAB — I-STAT CHEM 8, ED
BUN: 4 mg/dL — ABNORMAL LOW (ref 6–20)
CALCIUM ION: 1.13 mmol/L — AB (ref 1.15–1.40)
Chloride: 106 mmol/L (ref 101–111)
Creatinine, Ser: 0.5 mg/dL (ref 0.44–1.00)
GLUCOSE: 100 mg/dL — AB (ref 65–99)
HCT: 36 % (ref 36.0–46.0)
HEMOGLOBIN: 12.2 g/dL (ref 12.0–15.0)
Potassium: 3.4 mmol/L — ABNORMAL LOW (ref 3.5–5.1)
SODIUM: 142 mmol/L (ref 135–145)
TCO2: 23 mmol/L (ref 22–32)

## 2017-09-06 LAB — I-STAT BETA HCG BLOOD, ED (MC, WL, AP ONLY): I-stat hCG, quantitative: <5 m[IU]/mL

## 2017-09-06 LAB — CBC WITH DIFFERENTIAL/PLATELET
BASOS ABS: 0 10*3/uL (ref 0.0–0.1)
BASOS PCT: 0 %
EOS ABS: 0.1 10*3/uL (ref 0.0–0.7)
Eosinophils Relative: 1 %
HCT: 31.1 % — ABNORMAL LOW (ref 36.0–46.0)
Hemoglobin: 10.7 g/dL — ABNORMAL LOW (ref 12.0–15.0)
LYMPHS ABS: 1.7 10*3/uL (ref 0.7–4.0)
LYMPHS PCT: 13 %
MCH: 22.7 pg — ABNORMAL LOW (ref 26.0–34.0)
MCHC: 34.4 g/dL (ref 30.0–36.0)
MCV: 66 fL — AB (ref 78.0–100.0)
MONO ABS: 0.7 10*3/uL (ref 0.1–1.0)
MONOS PCT: 5 %
NEUTROS PCT: 81 %
Neutro Abs: 10.6 10*3/uL — ABNORMAL HIGH (ref 1.7–7.7)
PLATELETS: 246 10*3/uL (ref 150–400)
RBC: 4.71 MIL/uL (ref 3.87–5.11)
RDW: 15.7 % — AB (ref 11.5–15.5)
WBC: 13.1 10*3/uL — ABNORMAL HIGH (ref 4.0–10.5)

## 2017-09-06 MED ORDER — TRANEXAMIC ACID 1000 MG/10ML IV SOLN
500.0000 mg | Freq: Once | INTRAVENOUS | Status: AC
Start: 2017-09-06 — End: 2017-09-06
  Administered 2017-09-06: 500 mg via TOPICAL
  Filled 2017-09-06 (×2): qty 10
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
## 2017-09-06 NOTE — ED Notes (Signed)
Bleeding reassessed with PA, appears to have stopped. Kerlex with TXA removed. Pt will be transferred to MAU with [facility]. Accepting MD Dr. Alysia Penna.
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
## 2017-09-06 NOTE — ED Notes (Addendum)
PA assisted to apply TXA to vaginal wall during pelvic exam. Pt tolerated well.

## 2017-09-06 NOTE — ED Notes (Addendum)
Report given to [facility], MAU RN. Pt's girlfriend will be driving them there. IV was removed. Dr. Alysia Penna is accepting MD.

## 2017-09-06 NOTE — Discharge Instructions (Addendum)
Please report directly to the MAU at the [facility], for further evaluation.

## 2017-09-06 NOTE — ED Notes (Signed)
Pt not wanting to go by [facility] due to being unable for her girlfriend to ride with them. Also, pt is uncertain of how they would get back to her car here. [facility]

## 2017-09-06 NOTE — ED Triage Notes (Signed)
Pt presents with reports of heavy vaginal bleeding x 2 hours, per family in room pt has used 4 pads and multiple tampons as well as bled through clothing several times.  Pt also reporting "internal pain, down there".  Pt currently has adult depends on over shorts.

## 2018-01-24 ENCOUNTER — Other Ambulatory Visit: Payer: Self-pay

## 2018-01-24 ENCOUNTER — Encounter: Payer: Self-pay | Admitting: Emergency Medicine

## 2018-01-24 ENCOUNTER — Emergency Department
Admission: EM | Admit: 2018-01-24 | Discharge: 2018-01-24 | Disposition: A | Discharge status: Home / Self Care | Payer: Self-pay | Attending: Emergency Medicine | Admitting: Emergency Medicine

## 2018-01-24 DIAGNOSIS — F17210 Nicotine dependence, cigarettes, uncomplicated: Secondary | ICD-10-CM | POA: Insufficient documentation

## 2018-01-24 DIAGNOSIS — J45909 Unspecified asthma, uncomplicated: Secondary | ICD-10-CM | POA: Insufficient documentation

## 2018-01-24 DIAGNOSIS — B084 Enteroviral vesicular stomatitis with exanthem: Secondary | ICD-10-CM | POA: Insufficient documentation

## 2018-01-24 LAB — CBC WITH DIFFERENTIAL/PLATELET
Abs Immature Granulocytes: 0 10*3/uL (ref 0.00–0.07)
BASOS PCT: 1 %
Basophils Absolute: 0 10*3/uL (ref 0.0–0.1)
EOS ABS: 0.1 10*3/uL (ref 0.0–0.5)
Eosinophils Relative: 2 %
HEMATOCRIT: 31.2 % — AB (ref 36.0–46.0)
HEMOGLOBIN: 10.9 g/dL — AB (ref 12.0–15.0)
IMMATURE GRANULOCYTES: 0 %
Lymphocytes Relative: 35 %
Lymphs Abs: 1.6 10*3/uL (ref 0.7–4.0)
MCH: 24 pg — AB (ref 26.0–34.0)
MCHC: 34.9 g/dL (ref 30.0–36.0)
MCV: 68.6 fL — ABNORMAL LOW (ref 80.0–100.0)
MONO ABS: 0.4 10*3/uL (ref 0.1–1.0)
MONOS PCT: 9 %
NEUTROS ABS: 2.4 10*3/uL (ref 1.7–7.7)
NEUTROS PCT: 53 %
Platelets: 212 10*3/uL (ref 150–400)
RBC: 4.55 MIL/uL (ref 3.87–5.11)
RDW: 15.8 % — AB (ref 11.5–15.5)
SMEAR REVIEW: NORMAL
WBC: 4.4 10*3/uL (ref 4.0–10.5)
nRBC: 0 % (ref 0.0–0.2)

## 2018-01-24 LAB — COMPREHENSIVE METABOLIC PANEL
ALK PHOS: 52 U/L (ref 38–126)
ALT: 35 U/L (ref 0–44)
ANION GAP: 7 (ref 5–15)
AST: 29 U/L (ref 15–41)
Albumin: 3.9 g/dL (ref 3.5–5.0)
BILIRUBIN TOTAL: 0.4 mg/dL (ref 0.3–1.2)
BUN: 13 mg/dL (ref 6–20)
CALCIUM: 8.3 mg/dL — AB (ref 8.9–10.3)
CO2: 24 mmol/L (ref 22–32)
Chloride: 109 mmol/L (ref 98–111)
Creatinine, Ser: 0.53 mg/dL (ref 0.44–1.00)
GFR calc Af Amer: >60 mL/min (ref >60)
GLUCOSE: 118 mg/dL — AB (ref 70–99)
POTASSIUM: 3.6 mmol/L (ref 3.5–5.1)
Sodium: 140 mmol/L (ref 135–145)
TOTAL PROTEIN: 7.4 g/dL (ref 6.5–8.1)

## 2018-01-24 NOTE — ED Triage Notes (Signed)
Painful rash both hands began today.

## 2018-01-24 NOTE — ED Notes (Signed)
See triage note  Presents with 2-3 days of hands burning  Developed rash to hands this am  States rash is extremely painful

## 2018-01-24 NOTE — Discharge Instructions (Addendum)
Follow-up with your primary care provider if any continued problems.  Increase fluids.  You may take Tylenol or ibuprofen as needed for hand pain.  If you develop any lesions in your mouth switched to soft foods such as yogurt or ice cream and avoid any foods that have acid such as tomato sauce.  Hand-foot-and-mouth is a viral illness and should run its course.  There is one test pending at the time of your discharge and should that test be positive you will be called.

## 2018-01-24 NOTE — ED Provider Notes (Signed)
[facility] Emergency Department Provider Note  ____________________________________________   First MD Initiated Contact with Patient 01/24/18 1251     (approximate)  I have reviewed the triage vital signs and the nursing notes.   HISTORY  Chief Complaint Rash    HPI Katherine Hensley is a 32 y.o. female patient presents to the ED with complaint of hand rash and burning for the last 2 to 3 days.  She states the rash was more prominent today and has become more painful.  Patient states that she has not seen a rash on the bottoms of her feet but noticed that there is starting to be some "tingling sensation".  Patient denies any lesions in her mouth.  She is unaware of any fever or chills.  She states that her son was diagnosed with hand-foot-and-mouth this week.  Past Medical History:  Diagnosis Date  . Anemia   . Asthma   . Depression   . Enlarged thyroid   . Sickle cell anemia [facility])     Patient Active Problem List   Diagnosis Date Noted  . Decreased fetal movement 07/26/2016  . Abdominal pain 06/24/2016  . Vaginal spotting 06/15/2016  . 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 Cannabis use disorder, moderate, dependence (HCC) 02/12/2016  . Opioid use disorder, mild, abuse (HCC) 02/12/2016  . Pregnant and not yet delivered 02/12/2016  . Suicide attempt (HCC) 02/12/2016  . Tobacco use disorder 02/12/2016    Past Surgical History:  Procedure Laterality Date  . CESAREAN SECTION      Prior to Admission medications   Not on File    Allergies Patient has no known allergies.  No family history on file.  Social History Social History   Tobacco Use  . Smoking status: Current Every Day Smoker    Types: Cigars  . Smokeless tobacco: Never Used  Substance Use Topics  . Alcohol use: Yes  . Drug use: Yes    Types: Marijuana    Review of Systems Constitutional: No fever/chills Eyes: No visual  changes. ENT: No sore throat.  No lesions to the mouth. Cardiovascular: Denies chest pain. Respiratory: Denies shortness of breath. Gastrointestinal: No abdominal pain.  No nausea, no vomiting.  No diarrhea.  No constipation. Genitourinary: Negative for dysuria.  No vaginal discharge. Musculoskeletal: Positive for bilateral hand pain. Skin: Positive for rash. Neurological: Negative for headaches, focal weakness or numbness. ____________________________________________   PHYSICAL EXAM:  VITAL SIGNS: ED Triage Vitals  Enc Vitals Group     BP 01/24/18 1231 119/65     Pulse Rate 01/24/18 1231 65     Resp 01/24/18 1231 16     Temp 01/24/18 1231 98.1 F (36.7 C)     Temp Source 01/24/18 1231 Oral     SpO2 01/24/18 1231 97 %     Weight 01/24/18 1231 175 lb (79.4 kg)     Height 01/24/18 1231 5\' 7"  (1.702 m)     Head Circumference --      Peak Flow --      Pain Score 01/24/18 1235 10     Pain Loc --      Pain Edu? --      Excl. in GC? --    Constitutional: Alert and oriented. Well appearing and in no acute distress. Eyes: Conjunctivae are normal. PERRL. EOMI. Head: Atraumatic. Nose: No congestion/rhinnorhea. Mouth/Throat: Mucous membranes are moist.  Oropharynx non-erythematous.  No lesions. Neck: No stridor.   Hematological/Lymphatic/Immunilogical:  No cervical lymphadenopathy. Cardiovascular: Normal rate, regular rhythm. Grossly normal heart sounds.  Good peripheral circulation. Respiratory: Normal respiratory effort.  No retractions. Lungs CTAB. Gastrointestinal: Soft and nontender. No distention.  Musculoskeletal: Moves upper and lower extremities with any difficulty normal gait was noted. Neurologic:  Normal speech and language. No gross focal neurologic deficits are appreciated. No gait instability. Skin:  Skin is warm, dry and intact.  On examination of the hands there is diffuse erythematous papular lesions noted to the palms of the hands.  No drainage noted.  Patient is  able to move digits without any difficulty.  No other lesions were noted. Psychiatric: Mood and affect are normal. Speech and behavior are normal.  ____________________________________________   LABS (all labs ordered are listed, but only abnormal results are displayed)  Labs Reviewed  COMPREHENSIVE METABOLIC PANEL - Abnormal; Notable for the following components:      Result Value   Glucose, Bld 118 (*)    Calcium 8.3 (*)    All other components within normal limits  CBC WITH DIFFERENTIAL/PLATELET - Abnormal; Notable for the following components:   Hemoglobin 10.9 (*)    HCT 31.2 (*)    MCV 68.6 (*)    MCH 24.0 (*)    RDW 15.8 (*)    All other components within normal limits  RPR    PROCEDURES  Procedure(s) performed: None  Procedures  Critical Care performed: No  ____________________________________________   INITIAL IMPRESSION / ASSESSMENT AND PLAN / ED COURSE  As part of my medical decision making, I reviewed the following data within the electronic MEDICAL RECORD NUMBER Notes from prior ED visits and [place] Controlled Substance Database  Patient presents to the ED with complaint of painful rash to her hands that began today.  She denies any other symptoms but states that she does have some tingling to her feet but has not seen any rash.  Currently she has a child who was diagnosed this week with hand-foot-and-mouth.  Patient's child is in the room currently sleeping.  Lab work is reassuring.  Patient was discharged prior to results of the RPR but with recent close exposure to hand-foot-and-mouth disease most likely she has this.  Patient is to follow-up with her primary care provider if any continued problems.  She also is instructed to take Tylenol or ibuprofen as needed.  ____________________________________________   FINAL CLINICAL IMPRESSION(S) / ED DIAGNOSES  Final diagnoses:  Hand, foot and mouth disease (HFMD)     ED Discharge Orders    None       Note:   This document was prepared using Dragon voice recognition software and may include unintentional dictation errors.    Tommi Rumps, PA-C 01/24/18 1510    Phineas Semen, MD 01/25/18 1102

## 2018-01-26 LAB — RPR: RPR Ser Ql: NONREACTIVE

## 2018-10-26 ENCOUNTER — Encounter: Payer: Self-pay | Admitting: Emergency Medicine

## 2018-10-26 ENCOUNTER — Other Ambulatory Visit: Payer: Self-pay

## 2018-10-26 ENCOUNTER — Emergency Department
Admission: EM | Admit: 2018-10-26 | Discharge: 2018-10-26 | Disposition: A | Discharge status: Home / Self Care | Payer: HRSA Program | Attending: Emergency Medicine | Admitting: Emergency Medicine

## 2018-10-26 DIAGNOSIS — Z20828 Contact with and (suspected) exposure to other viral communicable diseases: Secondary | ICD-10-CM | POA: Insufficient documentation

## 2018-10-26 DIAGNOSIS — J45909 Unspecified asthma, uncomplicated: Secondary | ICD-10-CM | POA: Diagnosis not present

## 2018-10-26 DIAGNOSIS — B349 Viral infection, unspecified: Secondary | ICD-10-CM

## 2018-10-26 DIAGNOSIS — F17290 Nicotine dependence, other tobacco product, uncomplicated: Secondary | ICD-10-CM | POA: Insufficient documentation

## 2018-10-26 DIAGNOSIS — R05 Cough: Secondary | ICD-10-CM | POA: Diagnosis present

## 2018-10-26 NOTE — Discharge Instructions (Addendum)
Quarantine yourself until your test is back. If negative and you do not have any concerning symptoms, you may resume your normal activities. If positive, you will need to quarantine for 2 weeks.

## 2018-10-26 NOTE — ED Provider Notes (Signed)
[facility] Emergency Department Provider Note  ____________________________________________  Time seen: Approximately 10:56 AM  I have reviewed the triage vital signs and the nursing notes.   HISTORY  Chief Complaint Cough   HPI Katherine Hensley is a 33 y.o. female who presents to the emergency department for COVID-19 testing after being exposed. She denies symptoms, she just wants to be tested. She states that she did have a cough for about 3 weeks, but it has gone away for the most part.    Past Medical History:  Diagnosis Date  . Anemia   . Asthma   . Depression   . Enlarged thyroid   . Sickle cell anemia [facility])     Patient Active Problem List   Diagnosis Date Noted  . Decreased fetal movement 07/26/2016  . Abdominal pain 06/24/2016  . Vaginal spotting 06/15/2016  . 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 Cannabis use disorder, moderate, dependence ([place]) 02/12/2016  . Opioid use disorder, mild, abuse ([place]) 02/12/2016  . Pregnant and not yet delivered 02/12/2016  . Suicide attempt ([place]) 02/12/2016  . Tobacco use disorder 02/12/2016    Past Surgical History:  Procedure Laterality Date  . CESAREAN SECTION      Prior to Admission medications   Not on File    Allergies Patient has no known allergies.  No family history on file.  Social History Social History   Tobacco Use  . Smoking status: Current Every Day Smoker    Types: Cigars  . Smokeless tobacco: Never Used  Substance Use Topics  . Alcohol use: Yes  . Drug use: Yes    Types: Marijuana    Review of Systems Constitutional: Negative for fever/chills. Normal appetite. ENT: No sore throat. Cardiovascular: Denies chest pain. Respiratory: Negative for shortness of breath. negative for cough. no wheezing.  Gastrointestinal: no nausea,  no vomiting.  no diarrhea.  Musculoskeletal: no for body aches Skin: no for rash. Neurological: no for  headaches ____________________________________________   PHYSICAL EXAM:  VITAL SIGNS: ED Triage Vitals  Enc Vitals Group     BP 10/26/18 1034 116/81     Pulse Rate 10/26/18 1034 61     Resp 10/26/18 1034 17     Temp 10/26/18 1034 98.8 F (37.1 C)     Temp Source 10/26/18 1034 Oral     SpO2 10/26/18 1034 97 %     Weight 10/26/18 1035 160 lb (72.6 kg)     Height 10/26/18 1035 5\' 7"  (1.702 m)     Head Circumference --      Peak Flow --      Pain Score 10/26/18 1046 0     Pain Loc --      Pain Edu? --      Excl. in [place]? --     Constitutional: Alert and oriented. Well appearing and in no acute distress. Eyes: Conjunctivae are normal. Ears: exam deferred Nose: no sinus congestion noted; no rhinnorhea. Mouth/Throat: Mucous membranes are moist.  Oropharynx normal. Tonsils not visualized. Uvula midline. Neck: No stridor.  Lymphatic: no cervical lymphadenopathy. Cardiovascular: Normal rate, regular rhythm. Good peripheral circulation. Respiratory: Respirations are even and unlabored.  No retractions. Breath sound clear. Gastrointestinal: Soft and nontender.  Musculoskeletal: FROM x 4 extremities.  Neurologic:  Normal speech and language. Skin:  Skin is warm, dry and intact. No rash noted. Psychiatric: Mood and affect are normal. Speech and behavior are normal.  ____________________________________________   LABS (all labs  ordered are listed, but only abnormal results are displayed)  Labs Reviewed  NOVEL CORONAVIRUS, NAA (HOSPITAL ORDER, SEND-OUT TO REF LAB)   ____________________________________________  EKG  Not indicated. ____________________________________________  RADIOLOGY  Not indicated. ____________________________________________   PROCEDURES  Procedure(s) performed: None  Critical Care performed: No ____________________________________________   INITIAL IMPRESSION / ASSESSMENT AND PLAN / ED COURSE  33 y.o. female presents to the emergency  department for COVID-19 testing.  Currently she is not experiencing any symptoms, but knows that she has had an exposure.  Patient was advised that her test will be resulted in 24 to 48 hours.  She was advised to quarantine herself until she is negative or 2 weeks if she is positive.  Katherine Hensley was evaluated in Emergency Department on 10/26/2018 for the symptoms described in the history of present illness. She was evaluated in the context of the global COVID-19 pandemic, which necessitated consideration that the patient might be at risk for infection with the SARS-CoV-2 virus that causes COVID-19. Institutional protocols and algorithms that pertain to the evaluation of patients at risk for COVID-19 are in a state of rapid change based on information released by regulatory bodies including the CDC and federal and state organizations. These policies and algorithms were followed during the patient's care in the ED.     Medications - No data to display  ED Discharge Orders    None       Pertinent labs & imaging results that were available during my care of the patient were reviewed by me and considered in my medical decision making (see chart for details).    If controlled substance prescribed during this visit, 12 month history viewed on the [place]CCSRS prior to issuing an initial prescription for Schedule II or III opiod. ____________________________________________   FINAL CLINICAL IMPRESSION(S) / ED DIAGNOSES  Final diagnoses:  Acute viral syndrome    Note:  This document was prepared using Dragon voice recognition software and may include unintentional dictation errors.    Chinita Pesterriplett, Arlester Keehan B, FNP 10/26/18 1712    Sharman CheekStafford, Phillip, MD 10/28/18 [id number]

## 2018-10-26 NOTE — ED Triage Notes (Signed)
C/O cough x 3 weeks.  Found out two days ago she had been exposed to [place] from being around her childs father.  Denies fever.

## 2018-10-26 NOTE — ED Notes (Signed)
See triage note  States she has had intermittent prod cough for a few days   Unsure of fever by but states she has had "hot flashes"

## 2018-10-28 ENCOUNTER — Telehealth: Payer: Self-pay | Admitting: Emergency Medicine

## 2018-10-28 LAB — NOVEL CORONAVIRUS, NAA (HOSP ORDER, SEND-OUT TO REF LAB; TAT 18-24 HRS): SARS-CoV-2, NAA: NOT DETECTED

## 2018-10-28 NOTE — Telephone Encounter (Signed)
Called patient and informed her of negative covid 19 result. 

## 2019-08-18 ENCOUNTER — Other Ambulatory Visit: Payer: Self-pay

## 2019-08-18 ENCOUNTER — Encounter: Payer: Self-pay | Admitting: Emergency Medicine

## 2019-08-18 ENCOUNTER — Emergency Department: Payer: Medicaid Other

## 2019-08-18 DIAGNOSIS — R0789 Other chest pain: Secondary | ICD-10-CM | POA: Insufficient documentation

## 2019-08-18 DIAGNOSIS — Z20822 Contact with and (suspected) exposure to covid-19: Secondary | ICD-10-CM | POA: Insufficient documentation

## 2019-08-18 DIAGNOSIS — R5383 Other fatigue: Secondary | ICD-10-CM | POA: Diagnosis not present

## 2019-08-18 DIAGNOSIS — Z5321 Procedure and treatment not carried out due to patient leaving prior to being seen by health care provider: Secondary | ICD-10-CM | POA: Insufficient documentation

## 2019-08-18 DIAGNOSIS — R519 Headache, unspecified: Secondary | ICD-10-CM | POA: Diagnosis present

## 2019-08-18 LAB — CBC
HCT: 27.6 % — ABNORMAL LOW (ref 36.0–46.0)
Hemoglobin: 9.3 g/dL — ABNORMAL LOW (ref 12.0–15.0)
MCH: 19.3 pg — ABNORMAL LOW (ref 26.0–34.0)
MCHC: 33.7 g/dL (ref 30.0–36.0)
MCV: 57.1 fL — ABNORMAL LOW (ref 80.0–100.0)
Platelets: 265 10*3/uL (ref 150–400)
RBC: 4.83 MIL/uL (ref 3.87–5.11)
RDW: 23.1 % — ABNORMAL HIGH (ref 11.5–15.5)
WBC: 4.1 10*3/uL (ref 4.0–10.5)
nRBC: 0 % (ref 0.0–0.2)

## 2019-08-18 LAB — BASIC METABOLIC PANEL
Anion gap: 9 (ref 5–15)
BUN: 12 mg/dL (ref 6–20)
CO2: 24 mmol/L (ref 22–32)
Calcium: 8.9 mg/dL (ref 8.9–10.3)
Chloride: 104 mmol/L (ref 98–111)
Creatinine, Ser: 0.42 mg/dL — ABNORMAL LOW (ref 0.44–1.00)
GFR calc Af Amer: >60 mL/min (ref >60)
GFR calc non Af Amer: >60 mL/min (ref >60)
Glucose, Bld: 125 mg/dL — ABNORMAL HIGH (ref 70–99)
Potassium: 3.8 mmol/L (ref 3.5–5.1)
Sodium: 137 mmol/L (ref 135–145)

## 2019-08-18 LAB — TROPONIN I (HIGH SENSITIVITY): Troponin I (High Sensitivity): <2 ng/L (ref <18)

## 2019-08-18 NOTE — ED Triage Notes (Signed)
Pt c/o HA and general fatigue since Monday when she received the COVID vaccination (Moderna.) Pt to ED due to chest pain. Pt denies SOB.

## 2019-08-19 ENCOUNTER — Other Ambulatory Visit: Payer: Self-pay

## 2019-08-19 ENCOUNTER — Emergency Department
Admission: EM | Admit: 2019-08-19 | Discharge: 2019-08-19 | Disposition: A | Discharge status: Home / Self Care | Payer: Medicaid Other | Attending: Emergency Medicine | Admitting: Emergency Medicine

## 2019-08-19 ENCOUNTER — Emergency Department
Admission: EM | Admit: 2019-08-19 | Discharge: 2019-08-19 | Discharge status: Against Medical Advice | Payer: Medicaid Other | Source: Home / Self Care | Attending: Emergency Medicine | Admitting: Emergency Medicine

## 2019-08-19 DIAGNOSIS — R197 Diarrhea, unspecified: Secondary | ICD-10-CM

## 2019-08-19 DIAGNOSIS — R079 Chest pain, unspecified: Secondary | ICD-10-CM

## 2019-08-19 LAB — CBC
HCT: 28.2 % — ABNORMAL LOW (ref 36.0–46.0)
Hemoglobin: 9.6 g/dL — ABNORMAL LOW (ref 12.0–15.0)
MCH: 19.6 pg — ABNORMAL LOW (ref 26.0–34.0)
MCHC: 34 g/dL (ref 30.0–36.0)
MCV: 57.4 fL — ABNORMAL LOW (ref 80.0–100.0)
Platelets: 269 10*3/uL (ref 150–400)
RBC: 4.91 MIL/uL (ref 3.87–5.11)
RDW: 23.4 % — ABNORMAL HIGH (ref 11.5–15.5)
WBC: 4.2 10*3/uL (ref 4.0–10.5)
nRBC: 0 % (ref 0.0–0.2)

## 2019-08-19 LAB — POCT PREGNANCY, URINE: Preg Test, Ur: NEGATIVE

## 2019-08-19 LAB — HEPATIC FUNCTION PANEL
ALT: 57 U/L — ABNORMAL HIGH (ref 0–44)
AST: 38 U/L (ref 15–41)
Albumin: 3.5 g/dL (ref 3.5–5.0)
Alkaline Phosphatase: 117 U/L (ref 38–126)
Bilirubin, Direct: <0.1 mg/dL (ref 0.0–0.2)
Total Bilirubin: 0.3 mg/dL (ref 0.3–1.2)
Total Protein: 7.6 g/dL (ref 6.5–8.1)

## 2019-08-19 LAB — BASIC METABOLIC PANEL
Anion gap: 8 (ref 5–15)
BUN: 10 mg/dL (ref 6–20)
CO2: 23 mmol/L (ref 22–32)
Calcium: 9.3 mg/dL (ref 8.9–10.3)
Chloride: 106 mmol/L (ref 98–111)
Creatinine, Ser: <0.3 mg/dL — ABNORMAL LOW (ref 0.44–1.00)
Glucose, Bld: 115 mg/dL — ABNORMAL HIGH (ref 70–99)
Potassium: 4.1 mmol/L (ref 3.5–5.1)
Sodium: 137 mmol/L (ref 135–145)

## 2019-08-19 LAB — URINALYSIS, COMPLETE (UACMP) WITH MICROSCOPIC
Bacteria, UA: NONE SEEN
Bilirubin Urine: NEGATIVE
Glucose, UA: NEGATIVE mg/dL
Hgb urine dipstick: NEGATIVE
Ketones, ur: NEGATIVE mg/dL
Leukocytes,Ua: NEGATIVE
Nitrite: NEGATIVE
Protein, ur: NEGATIVE mg/dL
Specific Gravity, Urine: 1.015 (ref 1.005–1.030)
pH: 7 (ref 5.0–8.0)

## 2019-08-19 LAB — TROPONIN I (HIGH SENSITIVITY): Troponin I (High Sensitivity): <2 ng/L (ref <18)

## 2019-08-19 LAB — LIPASE, BLOOD: Lipase: 29 U/L (ref 11–51)

## 2019-08-19 LAB — SARS CORONAVIRUS 2 (TAT 6-24 HRS): SARS Coronavirus 2: NEGATIVE

## 2019-08-19 NOTE — ED Notes (Signed)
Has been up to bathroom couple of times  Was able to give urine spec   Has not had any diarrhea stool since arrival to ED

## 2019-08-19 NOTE — ED Triage Notes (Signed)
Pt here with bodyaches and cp since getting covid vaccine Monday. Pt also c/o diarrhea with small amounts of blood.

## 2019-08-19 NOTE — ED Provider Notes (Signed)
[facility] Emergency Department Provider Note  ____________________________________________  Time seen: Approximately 11:29 AM  I have reviewed the triage vital signs and the nursing notes.   HISTORY  Chief Complaint Generalized Body Aches    HPI Katherine Hensley is a 34 y.o. female that presents to the emergency department for evaluation of headache, chills, body aches, central chest pain, abdominal discomfort, diarrhea for 3 days.  Patient received [place] vaccine on Monday.  Symptoms started the following day.  Patient has noticed a couple of drops of blood when she has diarrhea.  Patient states that she is having diarrhea every 30 minutes to every hour. Chest pain is worse when she takes a deep breath but she is not short of breath. Patient presented to the emergency department last night but left prior to being evaluated.  Patient receives iron infusions for chronic anemia.  Patient states that she has been told before that she has some inflammation in her intestines.  No shortness of breath, cough, vomiting.  Past Medical History:  Diagnosis Date  . Anemia   . Asthma   . Depression   . Enlarged thyroid   . Sickle cell anemia [facility])     Patient Active Problem List   Diagnosis Date Noted  . Decreased fetal movement 07/26/2016  . Abdominal pain 06/24/2016  . Vaginal spotting 06/15/2016  . 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 Cannabis use disorder, moderate, dependence (HCC) 02/12/2016  . Opioid use disorder, mild, abuse (HCC) 02/12/2016  . Pregnant and not yet delivered 02/12/2016  . Suicide attempt (HCC) 02/12/2016  . Tobacco use disorder 02/12/2016    Past Surgical History:  Procedure Laterality Date  . CESAREAN SECTION      Prior to Admission medications   Not on File    Allergies Patient has no known allergies.  No family history on file.  Social History Social History   Tobacco Use  .  Smoking status: Current Every Day Smoker    Types: Cigars  . Smokeless tobacco: Never Used  Substance Use Topics  . Alcohol use: Yes  . Drug use: Yes    Types: Marijuana     Review of Systems  Constitutional: Positive for chills. Eyes: No visual changes. No discharge. ENT: Negative for congestion and rhinorrhea. Cardiovascular: Positive for central chest pain. Respiratory: Negative for cough. No SOB. Gastrointestinal: Positive for abdominal discomfort.  No nausea, no vomiting.  Positive for diarrhea. Musculoskeletal: Positive for body aches. Skin: Negative for rash, abrasions, lacerations, ecchymosis. Neurological: Positive for headache.   ____________________________________________   PHYSICAL EXAM:  VITAL SIGNS: ED Triage Vitals  Enc Vitals Group     BP 08/19/19 0948 (!) 127/58     Pulse Rate 08/19/19 0948 85     Resp 08/19/19 0948 18     Temp 08/19/19 0948 98.3 F (36.8 C)     Temp Source 08/19/19 0948 Oral     SpO2 08/19/19 0948 99 %     Weight 08/19/19 0949 170 lb (77.1 kg)     Height 08/19/19 0949 5\' 7"  (1.702 m)     Head Circumference --      Peak Flow --      Pain Score 08/19/19 0949 8     Pain Loc --      Pain Edu? --      Excl. in [place]? --      Constitutional: Alert and oriented. Well appearing and in no acute distress. Eyes:  Conjunctivae are normal. PERRL. EOMI. No discharge. Head: Atraumatic. ENT: No frontal and maxillary sinus tenderness.      Ears: Tympanic membranes pearly gray with good landmarks. No discharge.      Nose: No congestion/rhinnorhea.      Mouth/Throat: Mucous membranes are moist. Oropharynx non-erythematous. Tonsils not enlarged. No exudates. Uvula midline. Neck: No stridor.   Hematological/Lymphatic/Immunilogical: No cervical lymphadenopathy. Cardiovascular: Normal rate, regular rhythm.  Good peripheral circulation. Respiratory: Normal respiratory effort without tachypnea or retractions. Lungs CTAB. Good air entry to the bases  with no decreased or absent breath sounds. Gastrointestinal: Bowel sounds 4 quadrants. Soft and nontender to palpation. No guarding or rigidity. No palpable masses. No distention. Musculoskeletal: Full range of motion to all extremities. No gross deformities appreciated. Neurologic:  Normal speech and language. No gross focal neurologic deficits are appreciated.  Skin:  Skin is warm, dry and intact. No rash noted. Psychiatric: Mood and affect are normal. Speech and behavior are normal. Patient exhibits appropriate insight and judgement.   ____________________________________________   LABS (all labs ordered are listed, but only abnormal results are displayed)  Labs Reviewed  CBC - Abnormal; Notable for the following components:      Result Value   Hemoglobin 9.6 (*)    HCT 28.2 (*)    MCV 57.4 (*)    MCH 19.6 (*)    RDW 23.4 (*)    All other components within normal limits  BASIC METABOLIC PANEL - Abnormal; Notable for the following components:   Glucose, Bld 115 (*)    Creatinine, Ser <0.30 (*)    All other components within normal limits  URINALYSIS, COMPLETE (UACMP) WITH MICROSCOPIC - Abnormal; Notable for the following components:   Color, Urine YELLOW (*)    APPearance CLEAR (*)    All other components within normal limits  HEPATIC FUNCTION PANEL - Abnormal; Notable for the following components:   ALT 57 (*)    All other components within normal limits  GASTROINTESTINAL PANEL BY PCR, STOOL (REPLACES STOOL CULTURE)  C DIFFICILE QUICK SCREEN W PCR REFLEX  SARS CORONAVIRUS 2 (TAT 6-24 HRS)  LIPASE, BLOOD  POC URINE PREG, ED  POCT PREGNANCY, URINE  TROPONIN I (HIGH SENSITIVITY)   ____________________________________________  EKG   ____________________________________________  RADIOLOGY Lexine Baton, personally viewed and evaluated these images (plain radiographs) as part of my medical decision making, as well as reviewing the written report by the  radiologist.  DG Chest 2 View  Result Date: 08/18/2019 CLINICAL DATA:  34 year old female with chest pain. EXAM: CHEST - 2 VIEW COMPARISON:  Chest radiograph dated 05/09/2016. FINDINGS: The lungs are clear. There is no pleural effusion or pneumothorax. The cardiac silhouette is within limits. No acute osseous pathology. Mild thoracic dextroscoliosis. IMPRESSION: No active cardiopulmonary disease. Electronically Signed   By: Elgie Collard M.D.   On: 08/18/2019 20:55    ____________________________________________    PROCEDURES  Procedure(s) performed:    Procedures    Medications - No data to display   ____________________________________________   INITIAL IMPRESSION / ASSESSMENT AND PLAN / ED COURSE  Pertinent labs & imaging results that were available during my care of the patient were reviewed by me and considered in my medical decision making (see chart for details).  Review of the [place] CSRS was performed in accordance of the N[facility] prior to dispensing any controlled drugs.   Patient presented to the emergency department for evaluation of multiple complaints including headache, chills, body aches, chest pain, diarrhea.  Patient is afebrile in the emergency department.  CBC remarkable for hemoglobin 9.6, which has trended upward hemoglobin yesterday of 9.3.  Patient receives iron infusions for chronic anemia.  BMP largely unremarkable.  Lipase within normal limits.  Troponin within normal limits.  Chest x-ray last night negative for acute cardiopulmonary disease.  Patient has been unable to give [place] a stool sample while in the emergency department.  Patient states that she needs to leave and elects to sign out [place].  She does not wish to stay for any imaging at this time.  Patient will return to the emergency department as needed.  Charis Juliana was evaluated in Emergency Department on 08/19/2019 for the symptoms described in the history of present illness. She was  evaluated in the context of the global COVID-19 pandemic, which necessitated consideration that the patient might be at risk for infection with the SARS-CoV-2 virus that causes COVID-19. Institutional protocols and algorithms that pertain to the evaluation of patients at risk for COVID-19 are in a state of rapid change based on information released by regulatory bodies including the CDC and federal and state organizations. These policies and algorithms were followed during the patient's care in the ED.     ____________________________________________  FINAL CLINICAL IMPRESSION(S) / ED DIAGNOSES  Final diagnoses:  Chest pain, unspecified type  Diarrhea, unspecified type      NEW MEDICATIONS STARTED DURING THIS VISIT:  ED Discharge Orders    None          This chart was dictated using voice recognition software/Dragon. Despite best efforts to proofread, errors can occur which can change the meaning. Any change was purely unintentional.    Laban Emperor, PA-C 08/19/19 1556    Lavonia Drafts, MD 08/20/19 1221

## 2019-08-19 NOTE — ED Notes (Signed)
Pt unable to urinate or give stool sample at this time, will continue to monitor.

## 2019-08-19 NOTE — ED Notes (Signed)
See triage note  Presents with body aches and diarrhea   States sxs' started on Tuesday and after she received her 1st COVID vaccine  Afebrile at present

## 2020-09-11 IMAGING — CR DG CHEST 2V
1 series · 2 of 2 positions shown · non-contrast
Comparison: Chest radiograph dated 05/09/2016.

CLINICAL DATA: 34-year-old female with chest pain.

EXAM:
CHEST - 2 VIEW

[Series 1: dg chest 2 view · 0.14mm/px · 2 of 2 slices shown]
[im 1/2]
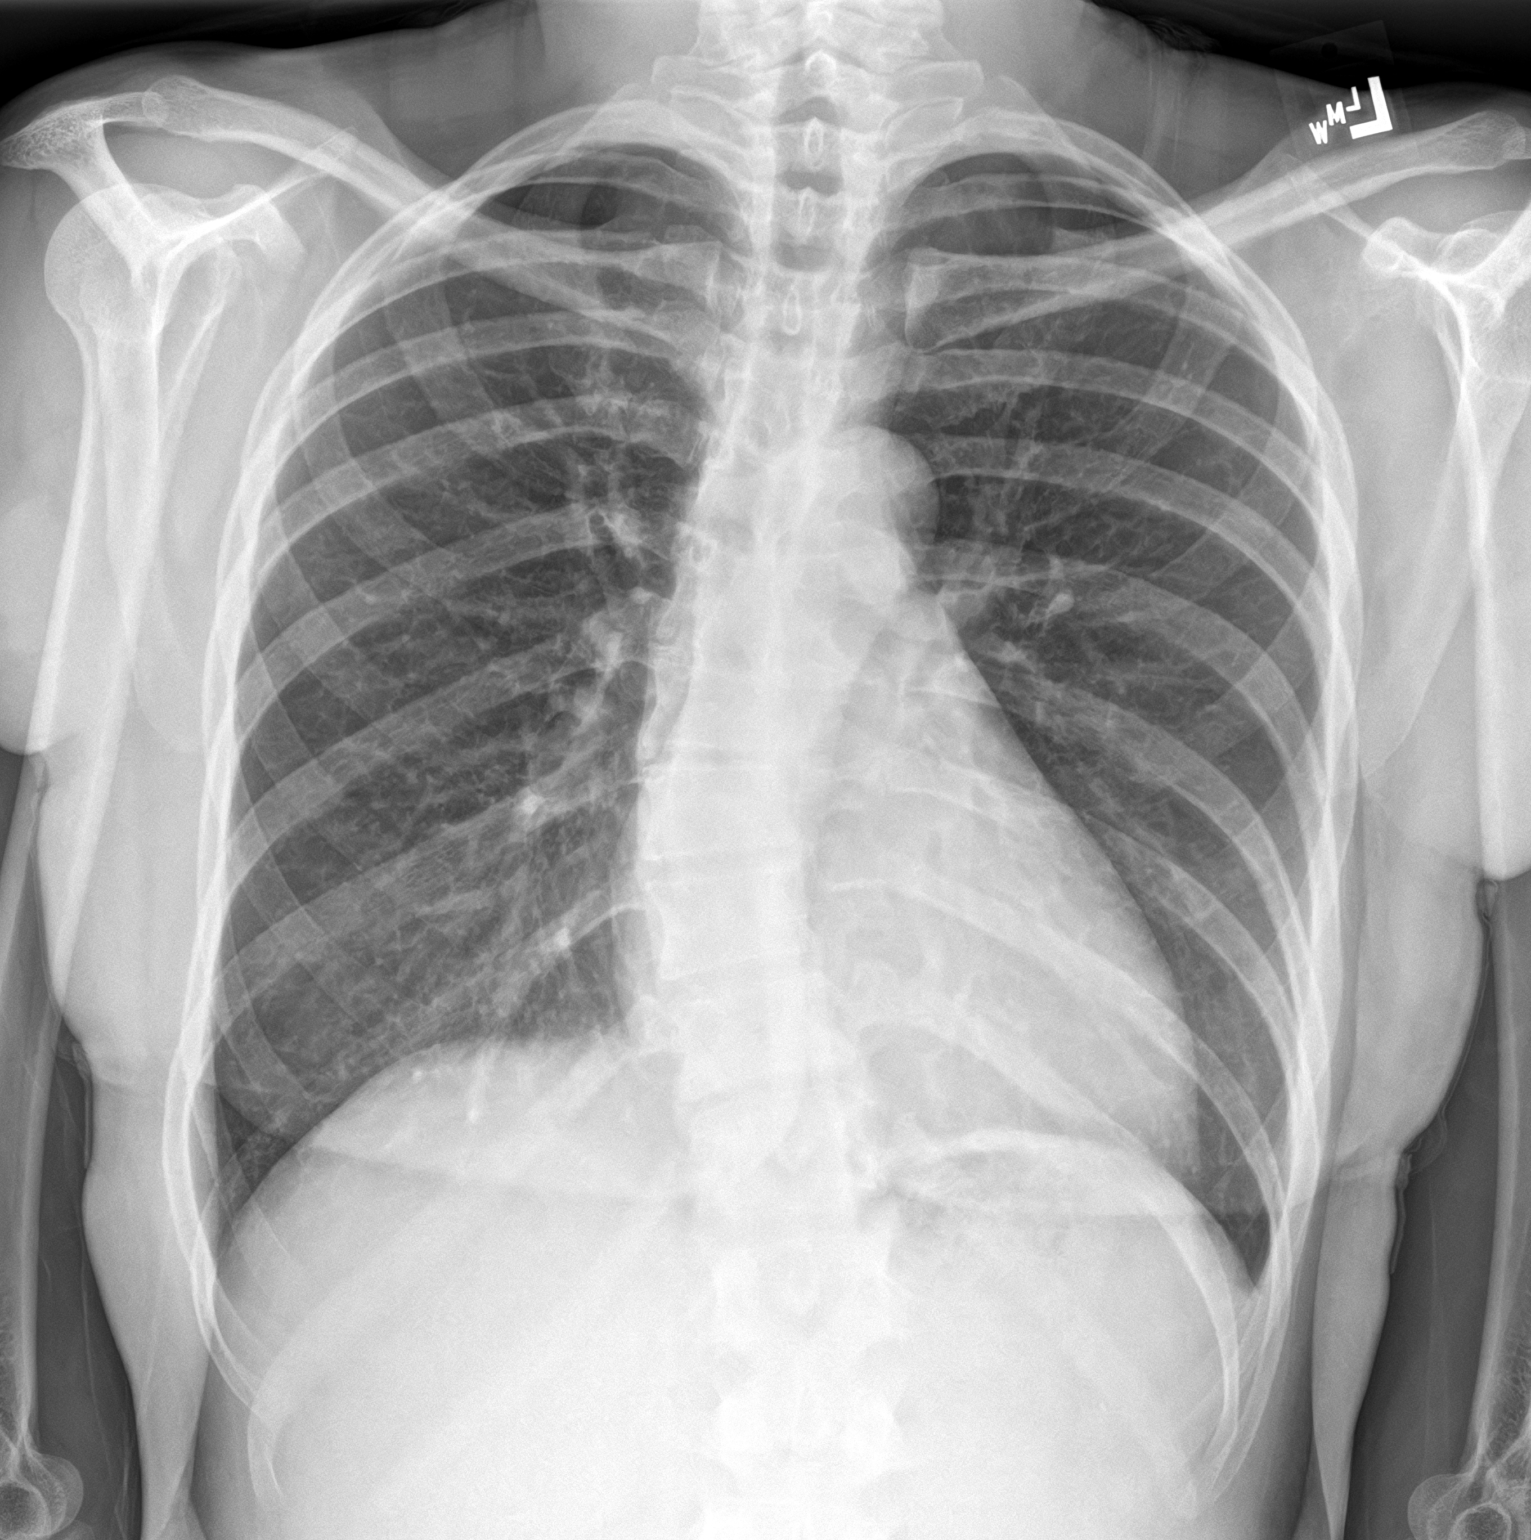
[im 2/2]
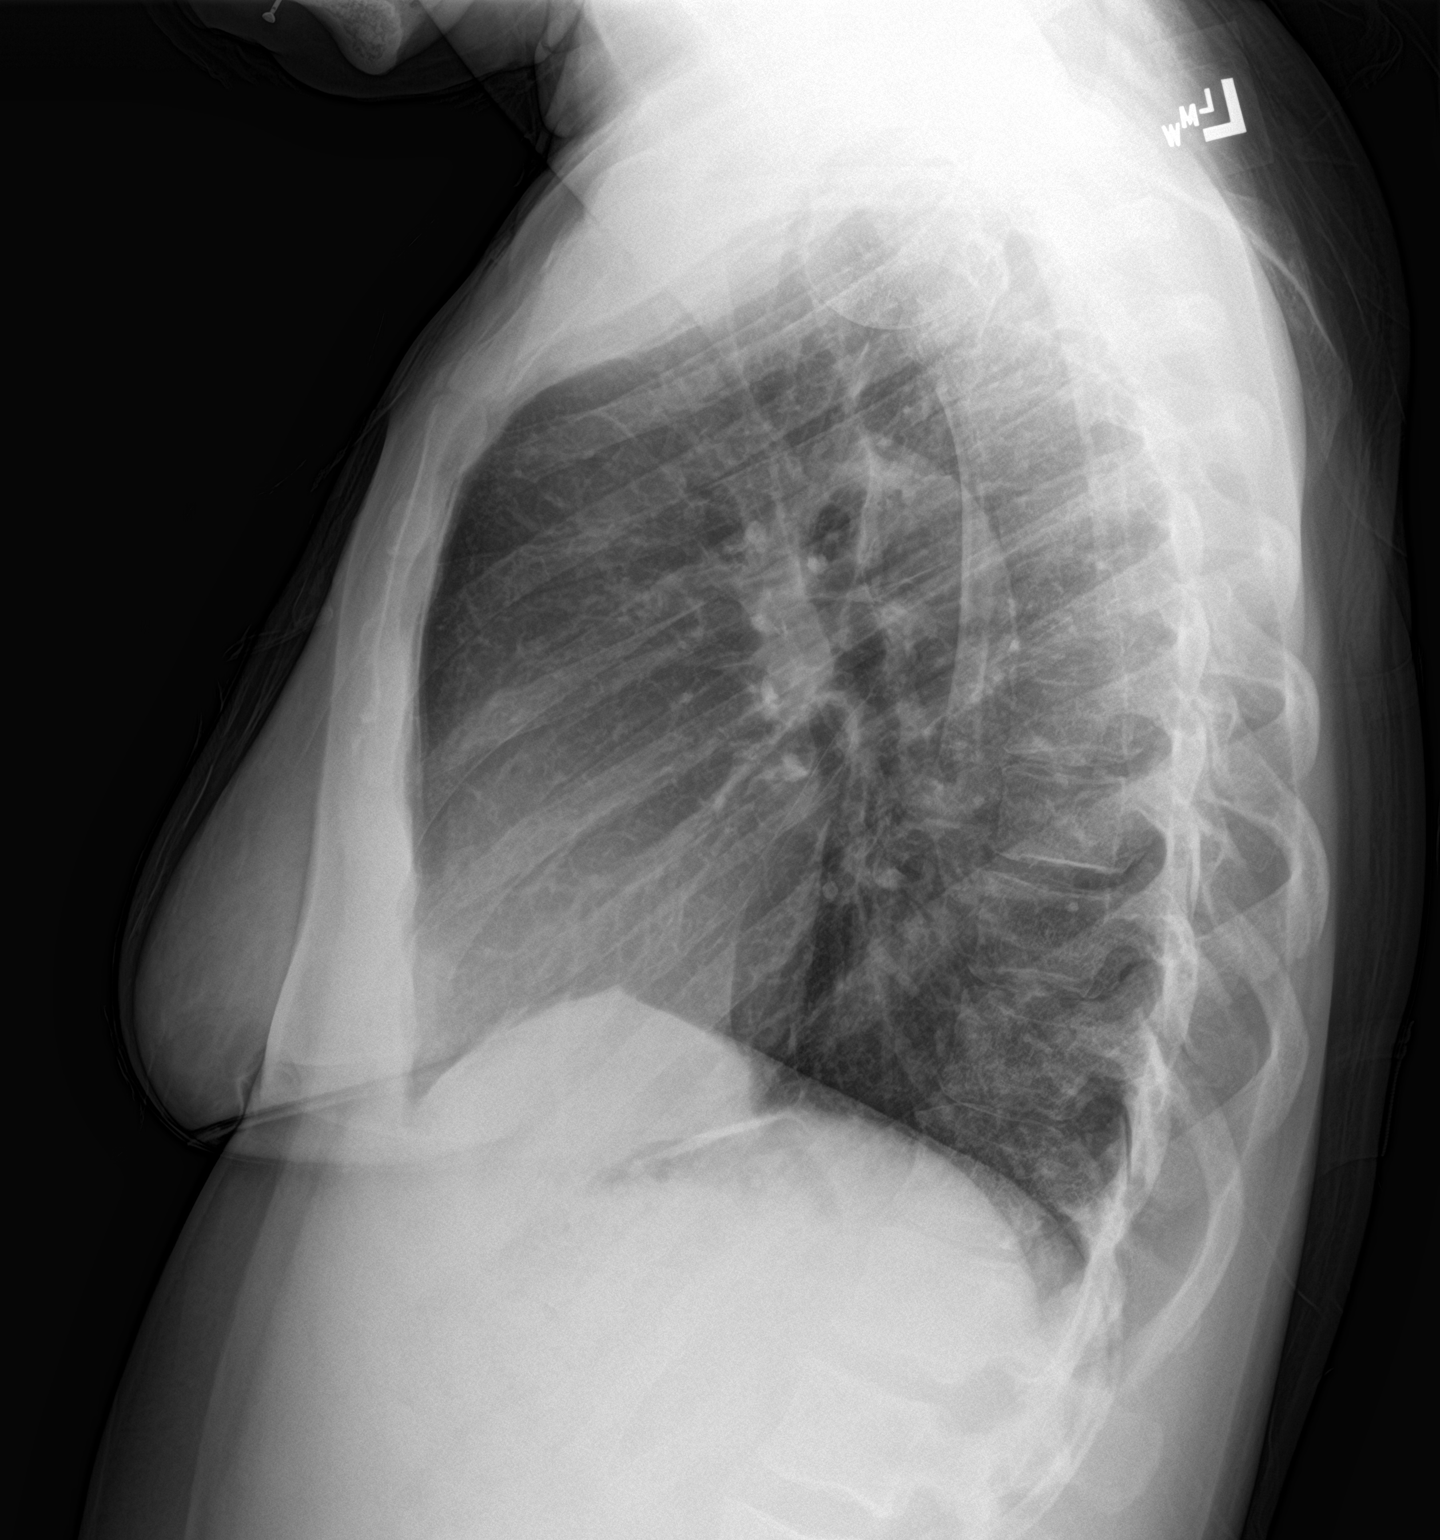

[2 of 2 positions shown; findings below may reference images not displayed]

FINDINGS: The lungs are clear. There is no pleural effusion or pneumothorax.
The cardiac silhouette is within limits. No acute osseous pathology.
Mild thoracic dextroscoliosis.
IMPRESSION: No active cardiopulmonary disease.
